# Patient Record
Sex: Male | Born: 1970
Health system: Southern US, Community
[De-identification: ages and names within clinical notes are randomized; demographics above are authoritative.]

## PROBLEM LIST (undated history)

## (undated) DIAGNOSIS — A630 Anogenital (venereal) warts: Secondary | ICD-10-CM

## (undated) DIAGNOSIS — K2100 Gastro-esophageal reflux disease with esophagitis, without bleeding: Secondary | ICD-10-CM

## (undated) DIAGNOSIS — T7840XA Allergy, unspecified, initial encounter: Secondary | ICD-10-CM

## (undated) DIAGNOSIS — K219 Gastro-esophageal reflux disease without esophagitis: Secondary | ICD-10-CM

## (undated) HISTORY — DX: Gastro-esophageal reflux disease with esophagitis, without bleeding: K21.00

## (undated) HISTORY — PX: FRACTURE SURGERY: SHX138

## (undated) HISTORY — DX: Anogenital (venereal) warts: A63.0

## (undated) HISTORY — PX: KNEE ARTHROSCOPY: SUR90

## (undated) HISTORY — DX: Gastro-esophageal reflux disease with esophagitis: K21.0

## (undated) HISTORY — DX: Allergy, unspecified, initial encounter: T78.40XA

## (undated) HISTORY — DX: Gastro-esophageal reflux disease without esophagitis: K21.9

---

## 1999-08-07 ENCOUNTER — Emergency Department (HOSPITAL_COMMUNITY): Admission: EM | Admit: 1999-08-07 | Discharge: 1999-08-07 | Payer: Self-pay | Admitting: Emergency Medicine

## 1999-10-16 ENCOUNTER — Encounter: Admission: RE | Admit: 1999-10-16 | Discharge: 1999-10-16 | Payer: Self-pay | Admitting: Family Medicine

## 1999-10-16 ENCOUNTER — Encounter: Payer: Self-pay | Admitting: Family Medicine

## 2005-04-07 ENCOUNTER — Encounter: Admission: RE | Admit: 2005-04-07 | Discharge: 2005-04-07 | Payer: Self-pay | Admitting: Sports Medicine

## 2005-04-15 ENCOUNTER — Ambulatory Visit (HOSPITAL_COMMUNITY): Admission: RE | Admit: 2005-04-15 | Discharge: 2005-04-15 | Payer: Self-pay | Admitting: Orthopedic Surgery

## 2005-12-27 ENCOUNTER — Encounter: Admission: RE | Admit: 2005-12-27 | Discharge: 2005-12-27 | Payer: Self-pay | Admitting: Family Medicine

## 2006-11-19 DIAGNOSIS — A630 Anogenital (venereal) warts: Secondary | ICD-10-CM

## 2006-11-19 HISTORY — DX: Anogenital (venereal) warts: A63.0

## 2006-11-30 ENCOUNTER — Ambulatory Visit: Payer: Self-pay | Admitting: Family Medicine

## 2007-01-31 ENCOUNTER — Encounter: Admission: RE | Admit: 2007-01-31 | Discharge: 2007-01-31 | Payer: Self-pay | Admitting: Family Medicine

## 2007-01-31 ENCOUNTER — Ambulatory Visit: Payer: Self-pay | Admitting: Family Medicine

## 2007-05-08 ENCOUNTER — Ambulatory Visit: Payer: Self-pay | Admitting: Family Medicine

## 2008-05-09 ENCOUNTER — Ambulatory Visit: Payer: Self-pay | Admitting: Family Medicine

## 2008-06-18 ENCOUNTER — Ambulatory Visit: Payer: Self-pay | Admitting: Family Medicine

## 2008-07-16 ENCOUNTER — Ambulatory Visit: Payer: Self-pay | Admitting: Family Medicine

## 2009-03-06 ENCOUNTER — Ambulatory Visit: Payer: Self-pay | Admitting: Family Medicine

## 2010-05-12 ENCOUNTER — Ambulatory Visit: Payer: Self-pay | Admitting: Family Medicine

## 2010-05-14 ENCOUNTER — Encounter: Admission: RE | Admit: 2010-05-14 | Discharge: 2010-05-14 | Payer: Self-pay | Admitting: Family Medicine

## 2010-05-26 ENCOUNTER — Ambulatory Visit: Payer: Self-pay | Admitting: Physician Assistant

## 2010-10-14 ENCOUNTER — Ambulatory Visit
Admission: RE | Admit: 2010-10-14 | Discharge: 2010-10-14 | Payer: Self-pay | Source: Home / Self Care | Attending: Family Medicine | Admitting: Family Medicine

## 2011-02-05 NOTE — Op Note (Signed)
[name], VACA NO.:  [id number]   MEDICAL RECORD NO.:  [id number]          PATIENT TYPE:  OIB   LOCATION:  2899                         FACILITY:  [facility]   PHYSICIAN:  Doralee Albino. Carola Frost, M.D. DATE OF BIRTH:  Sep 01, 1971   DATE OF PROCEDURE:  04/15/2005  DATE OF DISCHARGE:                                 OPERATIVE REPORT   PREOPERATIVE DIAGNOSES:  Right distal radius fracture.   POSTOP DIAGNOSIS:  Right distal radius fracture.   PROCEDURE:  Open reduction internal fixation, right distal radius, using the  DVR plate.   SURGEON:  Doralee Albino. Carola Frost, M.D.   ASSISTANT:  Cecil Cranker, PA   ANESTHESIA:  General.   COMPLICATIONS:  None.   TOTAL TOURNIQUET TIME:  An hour and 10 minutes.   BLOOD LOSS:  Less than 10 cc.   SPECIMENS:  None.   DRAINS:  None.   DISPOSITION:  PACU. Condition stable.   BRIEF SUMMARY OF INDICATIONS FOR PROCEDURE:  Bryan Khan is a 40 year old right-hand dominant white male who sustained  an intra-articular displaced dorsally comminuted distal radius fracture  which was initially evaluated with plain film and then CT scan. After a  discussion of the risk and benefits of surgery including possible infection,  nerve injury, vessel injury, loss of motion, arthritis, malunion and  nonunion, the patient wished to proceed.   DESCRIPTION OF PROCEDURE:  Bryan Khan is administered preoperative  antibiotics, taken to operating room where general anesthesia was induced  and his right upper extremity prepped and draped in usual sterile fashion  with tourniquet about the upper arm. After a standard volar Henry approach  with a 4.5 centimeter incision over the FCR tendon and incision of the deep  aspect of the tendon sheath, the pronator was revealed. The Esmarch bandage  was then used to exsanguinate the extremity prior to incising the fascia of  the pronator. Tourniquet was inflated to 250 mmHg.  The pronator fascia was  incised  along the radial edge leaving a cuff of tissue for repair.  It was  elevated with the [facility] and then Hohmann's placed along the radial styloid  and the ulnar corner of the distal radius. The fracture site was mobilized  with manual manipulation and then the Hohmann's used to apply distal and  compressive forces across the articular surface. The plate was then fixed to  the volar aspect of the radius using the sliding hole with the 35 screw.  Appropriate position proximally and distally was then held with an  additional 35 screws. These were not tightened all the way into the surface  of the plate so that upon final tightening we could achieve a few more  degrees of volar angulation. Multiple towels were stacked underneath the  carpus and reduction maneuver performed with direct dorsally directed  pressure as well as the Hohmann's as described above.  Screws were placed  into the ulnar corner fragment as well as the radial styloid fragment and  then reduction checked under fluoro with two views demonstrating restoration  of radial height and the articular congruity of  the hardware placement was  appropriate as well. The wound was irrigated and then closed in standard  layered fashion with 2-0 Vicryl for the pronator and subcu and nylon for the  skin. Sterile gently compressive dressing and volar splint with the wrist in  supination was then applied. The patient was awakened from his anesthesia  and taken to PACU in stable condition.   PROGNOSIS:  Mr. Clos prognosis with this injury is favorable given the  restoration of his height inclination and congruity.  We will see him back  in clinic in 10 days for suture removal amd at that time anticipate  transition into a removable brace and initiation of supervised early range  of motion.       [facility]/MEDQ  D:  04/15/2005  T:  04/15/2005  Job:  [id number]

## 2011-03-12 ENCOUNTER — Encounter: Payer: Self-pay | Admitting: Medical

## 2011-03-12 ENCOUNTER — Ambulatory Visit (INDEPENDENT_AMBULATORY_CARE_PROVIDER_SITE_OTHER): Admitting: Medical

## 2011-03-12 VITALS — BP 120/78 | HR 58 | Temp 98.2°F | Ht 69.0 in | Wt 178.0 lb

## 2011-03-12 DIAGNOSIS — R209 Unspecified disturbances of skin sensation: Secondary | ICD-10-CM

## 2011-03-12 DIAGNOSIS — R202 Paresthesia of skin: Secondary | ICD-10-CM

## 2011-03-12 DIAGNOSIS — G43919 Migraine, unspecified, intractable, without status migrainosus: Secondary | ICD-10-CM

## 2011-03-12 MED ORDER — SUMATRIPTAN-NAPROXEN SODIUM 85-500 MG PO TABS: 1.0000 | ORAL_TABLET | ORAL | Status: DC | PRN

## 2011-03-12 NOTE — Progress Notes (Signed)
Subjective:   HPI  Bryan Khan is a 40 y.o. male who presents for bad headache x 4 days.  He awoke 4 days ago with a headache, and his left arm has felt heavy and sore, but he has been working out.  He describes the headache as dull and throbbing, some sensitivity to sound, tearful at times.  The headache has been constant, but has been varying in intensity.  Using 5 Advil at a time, no relief.  Trigger unknown.  Exercising more than usual since 1.5 mo ago.  He notes that he has been sore in his shoulders from recent increased weight training.   He denies prior diagnosis of headache problems. He has not recently changed any dietary or caffeine consumption. He drinks coffee a few times a week. Drinks 1-2 alcoholic beverages a week. He does not skip meals and his meal consent has been consistent.  He denies allergy problems. Work has been more stressful in general. He is a Landscape architect. He did get married 2 weeks ago  No other aggravating or relieving factors.  No other c/o.  The following portions of the patient's history were reviewed and updated as appropriate: allergies, current medications, past family history, past medical history, past social history, past surgical history and problem list.   Review of Systems Constitutional: denies fever, chills, sweats, unexpected weight change, anorexia, fatigue Allergy: no congestion, sneezing Dermatology: denies rash ENT: no runny nose, ear pain, sore throat, hoarseness, sinus pain Cardiology: +intermittent CP 15-20 minutes, has seen Dr. Susann Givens for this prior - stress related; denies palpitations, edema Respiratory: denies cough, shortness of breath, wheezing Gastroenterology: +nausea; denies abdominal pain, vomiting, diarrhea, constipation, Hematology: denies bleeding or bruising problems Musculoskeletal: +left shoulder and neck stiff, but been lifting weights;  denies arthralgias, myalgias, joint swelling, back pain Ophthalmology: denies  vision changes Urology: denies dysuria, difficulty urinating, hematuria, urinary frequency, urgency Neurology: +left hand tingling x 3 days;  no weakness, tingling, numbness      Objective:   Physical Exam  General appearance: alert, no distress, WD/WN, white male HEENT: normocephalic, sclerae anicteric, PERRLA, EOMi, nares patent, no discharge or erythema, pharynx normal Oral cavity: MMM, no lesions Neck: supple, no lymphadenopathy, no thyromegaly, no masses, no bruits Heart: RRR, normal S1, S2, no murmurs Lungs: CTA bilaterally, no wheezes, rhonchi, or rales Abdomen: +bs, soft, non tender, non distended, no masses, no hepatomegaly, no splenomegaly Musculoskeletal: mild left neck tenderness and upper back tenderness on the left, otherwise non tender, no swelling, no obvious deformity Extremities: no edema, no cyanosis, no clubbing Pulses: 2+ symmetric, upper and lower extremities, normal cap refill Neurological: alert, oriented x 3, CN2-12 intact, strength normal upper extremities and lower extremities, sensation normal throughout, DTRs 2+ throughout, no cerebellar signs, gait normal Psychiatric: normal affect, behavior normal, pleasant    Assessment :    Encounter Diagnoses  Name Primary?  . Migraine with intractable migraine Yes  . Paresthesia      Plan:  Etiology most likely atypical migraine.  I advised that I would like him to try Teximet and remain here for the next hour for monitoring. However he declines.   Thus, I sent him home with 2 samples of Treximet.  I advised he take one tablet now, go rest in a quiet dark place, and if he has not improved in 2 hours can take the second tablet. Last him to call back this afternoon to let us known how he is feeling.  He ended up  calling back and said the headache was much improved.  I advised to recheck in 1 week.

## 2011-03-15 ENCOUNTER — Encounter: Payer: Self-pay | Admitting: Family Medicine

## 2011-03-15 DIAGNOSIS — A630 Anogenital (venereal) warts: Secondary | ICD-10-CM

## 2011-03-15 DIAGNOSIS — J309 Allergic rhinitis, unspecified: Secondary | ICD-10-CM | POA: Insufficient documentation

## 2011-03-15 DIAGNOSIS — M549 Dorsalgia, unspecified: Secondary | ICD-10-CM

## 2011-03-16 ENCOUNTER — Ambulatory Visit: Payer: Self-pay

## 2011-04-15 ENCOUNTER — Other Ambulatory Visit (INDEPENDENT_AMBULATORY_CARE_PROVIDER_SITE_OTHER)

## 2011-04-15 DIAGNOSIS — Z0000 Encounter for general adult medical examination without abnormal findings: Secondary | ICD-10-CM

## 2011-04-15 DIAGNOSIS — Z23 Encounter for immunization: Secondary | ICD-10-CM

## 2011-08-17 ENCOUNTER — Encounter: Payer: Self-pay | Admitting: Family Medicine

## 2011-08-18 ENCOUNTER — Ambulatory Visit (INDEPENDENT_AMBULATORY_CARE_PROVIDER_SITE_OTHER): Admitting: Family Medicine

## 2011-08-18 ENCOUNTER — Encounter: Payer: Self-pay | Admitting: Family Medicine

## 2011-08-18 VITALS — BP 118/80 | HR 60 | Wt 182.0 lb

## 2011-08-18 DIAGNOSIS — K219 Gastro-esophageal reflux disease without esophagitis: Secondary | ICD-10-CM

## 2011-08-18 DIAGNOSIS — F329 Major depressive disorder, single episode, unspecified: Secondary | ICD-10-CM

## 2011-08-18 MED ORDER — OMEPRAZOLE 20 MG PO CPDR: 20.0000 mg | DELAYED_RELEASE_CAPSULE | Freq: Every day | ORAL | Status: DC

## 2011-08-18 MED ORDER — CITALOPRAM HYDROBROMIDE 20 MG PO TABS: 20.0000 mg | ORAL_TABLET | Freq: Every day | ORAL | Status: DC

## 2011-08-18 NOTE — Patient Instructions (Signed)
Call the  numbers that I gave you to see which one takes your insurance and let me know

## 2011-08-18 NOTE — Progress Notes (Signed)
  Subjective:    Patient ID: Bryan Khan, male    DOB: 20-Oct-1970, 40 y.o.   MRN: [id number]  HPI He is here for consult concerning continued difficulty with reflux symptoms. He also complains of chest pain and headache but states the chest pain and headaches both occur at times of stress which is usually at the end of the month when his work related issues become more of an issue. He then went on to explain that he had been under a lot of stress dealing with family related issues and is concerned about possibly losing his job. He complains of loss of interest in daily activities. In the past he had been seen at the [facility].  Review of Systems     Objective:   Physical Exam  alert and tearful. His affect is appropriate.        Assessment & Plan:   1. Depression   2. GERD (gastroesophageal reflux disease)    I will renew his Prilosec. Gave him the phone numbers of several potential counseling centers that might take his insurance. Recheck here in one month. He was also placed on Celexa. Discussed possible side effects of this medicine with him.

## 2011-09-16 ENCOUNTER — Encounter: Payer: Self-pay | Admitting: Family Medicine

## 2011-09-16 ENCOUNTER — Ambulatory Visit (INDEPENDENT_AMBULATORY_CARE_PROVIDER_SITE_OTHER): Admitting: Family Medicine

## 2011-09-16 VITALS — BP 110/70 | HR 60 | Wt 181.0 lb

## 2011-09-16 DIAGNOSIS — F329 Major depressive disorder, single episode, unspecified: Secondary | ICD-10-CM

## 2011-09-16 NOTE — Progress Notes (Signed)
  Subjective:    Patient ID: Bryan Khan, male    DOB: 02/02/1971, 40 y.o.   MRN: [id number]  HPI He is here for recheck. He does state he is doing much better. He has switched counselors and is now going to [facility] psychological. He states that he is roughly 80% better feeling much more positive and happy about all aspects of his life. He also states he is having no reflux symptoms at the present time and is using Prilosec twice per week.  Review of Systems     Objective:   Physical Exam Alert and in no distress otherwise not examined      Assessment & Plan:   1. Depression   2  GERD Continue present medication regimen and in counseling. Recommend he reduce Prilosec to as needed. Recheck here in one month

## 2011-09-16 NOTE — Patient Instructions (Signed)
Continue with counseling. I will see you in one month.

## 2011-10-18 ENCOUNTER — Ambulatory Visit (INDEPENDENT_AMBULATORY_CARE_PROVIDER_SITE_OTHER): Admitting: Family Medicine

## 2011-10-18 ENCOUNTER — Encounter: Payer: Self-pay | Admitting: Family Medicine

## 2011-10-18 VITALS — BP 130/80 | HR 60 | Ht 70.0 in | Wt 184.0 lb

## 2011-10-18 DIAGNOSIS — F329 Major depressive disorder, single episode, unspecified: Secondary | ICD-10-CM

## 2011-10-18 DIAGNOSIS — F3289 Other specified depressive episodes: Secondary | ICD-10-CM

## 2011-10-18 MED ORDER — CITALOPRAM HYDROBROMIDE 40 MG PO TABS: 40.0000 mg | ORAL_TABLET | Freq: Every day | ORAL | Status: DC

## 2011-10-18 NOTE — Progress Notes (Signed)
  Subjective:    Patient ID: Bryan Khan, male    DOB: 18-Jul-1971, 41 y.o.   MRN: [id number]  HPI He is here for recheck. He states he is somewhat better from his last visit. He continues in counseling. His work is going quite well and he has a good attitude towards this.   Review of Systems     Objective:   Physical Exam Alert and in no distress with appropriate affect       Assessment & Plan:   1. Depression    after a discussion with him, have decided to increase his Celexa. He seems to be doing fairly well although I think we should try to help him do better. We'll continue in counseling. He is to call me in one month and if this new dosing works, I will continue and recheck in 6 months

## 2011-10-18 NOTE — Patient Instructions (Addendum)
Call me in a month to let me know how the new dosing is working Take your present medicines at 2 pills per day. Take them at one-time

## 2011-11-05 ENCOUNTER — Other Ambulatory Visit

## 2011-11-05 DIAGNOSIS — Z23 Encounter for immunization: Secondary | ICD-10-CM

## 2011-11-09 ENCOUNTER — Other Ambulatory Visit: Payer: Self-pay | Admitting: Family Medicine

## 2011-11-09 NOTE — Telephone Encounter (Signed)
Is this ok?

## 2012-01-25 ENCOUNTER — Ambulatory Visit (INDEPENDENT_AMBULATORY_CARE_PROVIDER_SITE_OTHER): Admitting: Family Medicine

## 2012-01-25 ENCOUNTER — Encounter: Payer: Self-pay | Admitting: Family Medicine

## 2012-01-25 VITALS — BP 110/70 | HR 81 | Wt 193.0 lb

## 2012-01-25 DIAGNOSIS — Z13220 Encounter for screening for lipoid disorders: Secondary | ICD-10-CM

## 2012-01-25 DIAGNOSIS — M25569 Pain in unspecified knee: Secondary | ICD-10-CM

## 2012-01-25 DIAGNOSIS — M25561 Pain in right knee: Secondary | ICD-10-CM

## 2012-01-25 LAB — LIPID PANEL
LDL Cholesterol: 88 mg/dL (ref 0–99)
Total CHOL/HDL Ratio: 2.7 Ratio
VLDL: 9 mg/dL (ref 0–40)

## 2012-01-25 NOTE — Progress Notes (Signed)
Addended by: Lavell Islam on: 01/25/2012 04:03 PM   Modules accepted: Orders

## 2012-01-25 NOTE — Progress Notes (Signed)
  Subjective:    Patient ID: Bryan Khan, male    DOB: 10/28/1970, 41 y.o.   MRN: [id number]  HPI He is here for evaluation of right knee pain. He relates this to starting about a year ago while running. He noted soreness in his knee. No popping, locking or grinding. He did stop running for several months. He started up again and immediately had difficulty with pain. He describes an incident of running in stating that the leg locked causing him to fall and this was in January. Although he describes this as a locking sensation he was able to extend his knee after the fall He stopped exercising but continues to have pain. .Review of Systems     Objective:   Physical Exam Small effusion is noted. Tenderness palpation to the posterior medial aspect of his knee joint. McMurray's testing causes pain. Anterior drawer is negative. Other ligaments intact.      Assessment & Plan:  Right knee possible meniscal damage. An attempt was made to order an MRI since he has had difficulty with this for a long time however his insurance would not allow this. He will followup with his regular primary care physician.

## 2012-01-26 NOTE — Progress Notes (Signed)
Quick Note:  The blood work is normal ______ 

## 2012-01-31 ENCOUNTER — Emergency Department (HOSPITAL_COMMUNITY)

## 2012-01-31 ENCOUNTER — Encounter (HOSPITAL_COMMUNITY): Payer: Self-pay | Admitting: *Deleted

## 2012-01-31 DIAGNOSIS — Y92009 Unspecified place in unspecified non-institutional (private) residence as the place of occurrence of the external cause: Secondary | ICD-10-CM | POA: Insufficient documentation

## 2012-01-31 DIAGNOSIS — X500XXA Overexertion from strenuous movement or load, initial encounter: Secondary | ICD-10-CM | POA: Insufficient documentation

## 2012-01-31 DIAGNOSIS — S93409A Sprain of unspecified ligament of unspecified ankle, initial encounter: Secondary | ICD-10-CM | POA: Insufficient documentation

## 2012-01-31 NOTE — ED Notes (Signed)
Twisted lt ankle 3 pm, swelling present.

## 2012-02-01 ENCOUNTER — Emergency Department (HOSPITAL_COMMUNITY)
Admission: EM | Admit: 2012-02-01 | Discharge: 2012-02-01 | Disposition: A | Discharge status: Home / Self Care | Attending: Emergency Medicine | Admitting: Emergency Medicine

## 2012-02-01 DIAGNOSIS — S93402A Sprain of unspecified ligament of left ankle, initial encounter: Secondary | ICD-10-CM

## 2012-02-01 MED ORDER — HYDROCODONE-ACETAMINOPHEN 5-325 MG PO TABS: ORAL_TABLET | ORAL | Status: DC

## 2012-02-01 MED ORDER — HYDROCODONE-ACETAMINOPHEN 5-325 MG PO TABS
1.0000 | ORAL_TABLET | Freq: Once | ORAL | Status: AC
  Administered 2012-02-01: 1 via ORAL
  Filled 2012-02-01: qty 1

## 2012-02-01 NOTE — ED Notes (Signed)
Pt. Alert and oriented, discharged to home, NAD noted

## 2012-02-01 NOTE — ED Notes (Signed)
Received pt. From triage via w/c, pt. Alert and oriented, c/o left ankle pain pt. Larey Seat  todasy

## 2012-02-01 NOTE — Discharge Instructions (Signed)
Ankle Sprain You have a sprained ankle. When you twist or sprain your ankle, the ligaments that hold the joint together are injured. This usually causes a lot of swelling and pain. Although these injuries can be quite severe, proper treatment will reduce your pain, shorten the period of disability, and help prevent re-injury. To treat a sprained ankle you should:  Elevate your ankle for the next 2 to 4 days.   During this period apply ice packs to the injury for 20 to 30 minutes every 2 to 3 hours.   Keep the ankle wrapped in a compression bandage or splint as long as it is painful or swollen.   Do not walk on your ankle if it still hurts. This can slow the healing. Gentle range of motion exercises, however, can help decrease disability.   Use crutches if necessary until weight bearing is painless.   Prescription pain medicine may be needed to relieve discomfort.  A plaster or fiberglass splint may be applied initially. As your sprain improves, air, foam or gel-lined braces can be used to protect the ankle from further injury until the joint is completely healed. Ankle rehabilitation exercises may also be used to speed your recovery and make the joint more stable. Most moderate ankle sprains will heal completely in 6 weeks. However, if the sprain is severe, a cast or even surgery may be needed. Restrict your activities and see your doctor for follow-up as advised. If you have persistent pain, further evaluation and x-rays may be needed. Document Released: 10/14/2004 Document Revised: 08/26/2011 Document Reviewed: 09/07/2008 [facility] Patient Information 2012 [place], [place].  Follow up with your MD as planned.

## 2012-02-01 NOTE — ED Provider Notes (Signed)
History     CSN: [id number]  Arrival date & time 01/31/12  2209   First MD Initiated Contact with Patient 02/01/12 0051      Chief Complaint  Patient presents with  . Ankle Pain    (Consider location/radiation/quality/duration/timing/severity/associated sxs/prior treatment) HPI Comments: Stepping out of his shed and inverted L ankle.  No other injuries.  Has appt with PCP in a few days for knee related injury.    Patient is a 41 y.o. male presenting with ankle pain. The history is provided by the patient. No language interpreter was used.  Ankle Pain  The incident occurred less than 1 hour ago. The incident occurred at home. The pain is present in the left ankle. The quality of the pain is described as throbbing. The pain is at a severity of 7/10. The pain has been constant since onset. Associated symptoms include inability to bear weight and tingling. Pertinent negatives include no numbness, no loss of motion, no muscle weakness and no loss of sensation. He reports no foreign bodies present. The symptoms are aggravated by bearing weight. He has tried nothing for the symptoms.    Past Medical History  Diagnosis Date  . Allergy   . 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 Reflux     Past Surgical History  Procedure Date  . Fracture surgery     Family History  Problem Relation Age of Onset  . Hyperlipidemia Father   . Anxiety disorder Father   . Aneurysm Cousin   . Hypertension Paternal Uncle   . Diabetes Maternal Grandfather   . Heart disease Maternal Grandfather   . Hypertension Paternal Grandmother   . Cancer Paternal Grandfather     History  Substance Use Topics  . Smoking status: Former Smoker    Quit date: 02/01/1994  . Smokeless tobacco: Never Used  . Alcohol Use: 1.0 oz/week    2 drink(s) per week      Review of Systems  Musculoskeletal:       Ankle injury  Neurological: Positive for tingling. Negative for weakness and numbness.       Tingling   All other systems  reviewed and are negative.    Allergies  Codeine  Home Medications   Current Outpatient Rx  Name Route Sig Dispense Refill  . CITALOPRAM HYDROBROMIDE 40 MG PO TABS Oral Take 1 tablet (40 mg total) by mouth daily. 30 tablet 5  . HYDROCODONE-ACETAMINOPHEN 5-325 MG PO TABS  One tab po q 4-6 hrs prn pain 20 tablet 0  . OMEPRAZOLE 20 MG PO CPDR Oral Take 1 capsule (20 mg total) by mouth daily. 30 capsule 11    BP 124/75  Pulse 76  Temp(Src) 98.2 F (36.8 C) (Oral)  Resp 20  Ht 5\' 9"  (1.753 m)  Wt 184 lb (83.462 kg)  BMI 27.17 kg/m2  SpO2 98%  Physical Exam  Nursing note and vitals reviewed. Constitutional: He is oriented to person, place, and time. He appears well-developed and well-nourished.  HENT:  Head: Normocephalic and atraumatic.  Eyes: EOM are normal.  Neck: Normal range of motion.  Cardiovascular: Normal rate, regular rhythm, normal heart sounds and intact distal pulses.   Pulmonary/Chest: Effort normal and breath sounds normal. No respiratory distress.  Abdominal: Soft. He exhibits no distension. There is no tenderness.  Musculoskeletal: He exhibits tenderness.       Swelling over L lateral malleolus.  No ecchymosis.  Skin intact.   Worse with palpation and weight bearing.  Skin intact.  Neurological: He is alert and oriented to person, place, and time.  Skin: Skin is warm and dry.  Psychiatric: He has a normal mood and affect. Judgment normal.    ED Course  Procedures (including critical care time)  Labs Reviewed - No data to display Dg Ankle Complete Left  01/31/2012  *RADIOLOGY REPORT*  Clinical Data: Twist twisting left ankle injury with lateral pain and swelling.  LEFT ANKLE COMPLETE - 3+ VIEW  Comparison: None.  Findings: Soft tissue swelling over the lateral malleolus noted without underlying fracture observed.  Plafond and talar dome appear intact.  Faint calcification anteriorly along the tibiotalar articulation shown on the lateral projection is of  uncertain significance, but could be due to spurring or conceivably a small anterior tibiotalar avulsion.  IMPRESSION:  1.  No abnormal anterolateral soft tissue swelling.  Small calcific density projecting anterior to the tibiotalar articulation is not correlated on other projections but could conceivably represent a small avulsion injury of the anterior tibia or talus.  MRI would be the preferred modality for further investigation, if clinically warranted.  Original Report Authenticated By: Dellia Cloud, M.D.     1. Left ankle sprain       MDM  [place], ice, elevation, declined crutches. rx-hydrocodone, 20 otc ibuprofen 800 mg TID        Worthy Rancher, PA 02/01/12 0106  Worthy Rancher, PA 02/01/12 [phone]

## 2012-02-02 NOTE — ED Provider Notes (Signed)
Medical screening examination/treatment/procedure(s) were performed by non-physician practitioner and as supervising physician I was immediately available for consultation/collaboration.  Nicoletta Dress. Colon Branch, MD 02/02/12 [phone]

## 2012-04-24 ENCOUNTER — Other Ambulatory Visit: Payer: Self-pay | Admitting: Family Medicine

## 2012-04-24 NOTE — Telephone Encounter (Signed)
He needs a follow-up appointment

## 2012-04-24 NOTE — Telephone Encounter (Signed)
IS THIS OK 

## 2012-04-24 NOTE — Telephone Encounter (Signed)
I called the medicine in but have him schedule a followup appointment

## 2012-04-24 NOTE — Telephone Encounter (Signed)
Pt informed of follow up appt he said he would call back when he gets his calender in front of him

## 2012-09-12 ENCOUNTER — Other Ambulatory Visit: Payer: Self-pay | Admitting: Family Medicine

## 2013-02-09 ENCOUNTER — Telehealth: Payer: Self-pay | Admitting: Family Medicine

## 2013-02-09 MED ORDER — PANTOPRAZOLE SODIUM 40 MG PO TBEC: 40.0000 mg | DELAYED_RELEASE_TABLET | Freq: Every day | ORAL | Status: DC

## 2013-02-09 NOTE — Telephone Encounter (Signed)
Try switching to protonix 40 mg poqday.

## 2013-02-09 NOTE — Telephone Encounter (Signed)
..  Patient aware and rx e-scribed

## 2013-03-14 ENCOUNTER — Ambulatory Visit (INDEPENDENT_AMBULATORY_CARE_PROVIDER_SITE_OTHER): Admitting: Physician Assistant

## 2013-03-14 ENCOUNTER — Encounter: Payer: Self-pay | Admitting: Physician Assistant

## 2013-03-14 DIAGNOSIS — M5430 Sciatica, unspecified side: Secondary | ICD-10-CM

## 2013-03-14 MED ORDER — PREDNISONE 20 MG PO TABS: ORAL_TABLET | ORAL | Status: DC

## 2013-03-14 MED ORDER — CYCLOBENZAPRINE HCL 10 MG PO TABS: 10.0000 mg | ORAL_TABLET | Freq: Three times a day (TID) | ORAL | Status: DC | PRN

## 2013-03-14 MED ORDER — MELOXICAM 7.5 MG PO TABS: 7.5000 mg | ORAL_TABLET | Freq: Every day | ORAL | Status: DC

## 2013-03-14 NOTE — Progress Notes (Signed)
Patient ID: Bryan Khan MRN: [id number], DOB: 06/15/1971, 42 y.o. Date of Encounter: 03/14/2013, 1:44 PM    Chief Complaint:  Chief Complaint  Patient presents with  . c/o rt hip pain x 2 mths     HPI: 42 y.o. year old white male is here for eval of pain in right lateral buttock area. Says that he has always been extremely active. In Aug 2013 he had right meniscus surgery . Surgeon told him not to run any more. However, he needed to run b/c of military-he rediscussed it with his surgeon etc. Nonetheless, has started back to running. Every time he runs, sometime b/t running 1/2 to 1 mile, he develops pain in right lateral buttock. 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  Reports that he has never had any real problems with his low back.About 3 years ago prior PCP did XRay (normal per pt) and treated with muscle relaxer.   He has no pain, numbness, tingling down leg beyond upper thigh level. No weakness in leg or foot.     Home Meds: See attached medication section for any medications that were entered at today's visit. The computer does not put those onto this list.The following list is a list of meds entered prior to today's visit.   Current Outpatient Prescriptions on File Prior to Visit  Medication Sig Dispense Refill  . pantoprazole (PROTONIX) 40 MG tablet Take 1 tablet (40 mg total) by mouth daily.  30 tablet  11  . citalopram (CELEXA) 40 MG tablet TAKE 1 TABLET EVERY DAY  30 tablet  0  . HYDROcodone-acetaminophen (NORCO) 5-325 MG per tablet One tab po q 4-6 hrs prn pain  20 tablet  0   No current facility-administered medications on file prior to visit.    Allergies:  Allergies  Allergen Reactions  . Codeine Nausea Only      Review of Systems: See HPI for pertinent ROS. All other ROS negative.    Physical Exam: Blood pressure 118/78, pulse 52, temperature 98.1 F (36.7 C), temperature source Oral, resp.  rate 16, height 5\' 10"  (1.778 m), weight 190 lb (86.183 kg)., Body mass index is 27.26 kg/(m^2). General: WNWD WM. Appears in no acute distress. Lungs: Clear bilaterally to auscultation without wheezes, rales, or rhonchi. Breathing is unlabored. Heart: Regular rhythm. No murmurs, rubs, or gallops. Msk:  Strength and tone normal for age. Low Back: No pain reproduced with palpation of low back. There is mild pain with palpation of right sciatic notch. Bilateral SLR and Hip Abduction normal. Right hip abd causes some pain in right buttock.  Bilateral leg strenght 5/5. Neuro: Alert and oriented X 3. Moves all extremities spontaneously. Gait is normal. CNII-XII grossly in tact. Psych:  Responds to questions appropriately with a normal affect.     ASSESSMENT AND PLAN:  42 y.o. year old male with  1. Low back pain with sciatica, right He is to rest the back. No running, lifting, bending etc. If pain does not resolve with prednisone and muscle relaxers in 1-2 weeks, then he is to f/u here.  He has been given note to turn into [employer] with no bending lifting running through 04/13/13. He says that as soon as that time is up, they will expect him to do PT test.  This will give him time to rest back for 1-2 weeks then start back his training for PT.  Cautioned  Flexeril may cause drowiness. Do not take Mobic until after prednisone completed.   - predniSONE (DELTASONE) 20 MG tablet; Take 3 daily for 2 days, then 2 daily for 2 days, then 1 daily for 2 days.  Dispense: 12 tablet; Refill: 0 - cyclobenzaprine (FLEXERIL) 10 MG tablet; Take 1 tablet (10 mg total) by mouth 3 (three) times daily as needed for muscle spasms.  Dispense: 30 tablet; Refill: 0 - meloxicam (MOBIC) 7.5 MG tablet; Take 1 tablet (7.5 mg total) by mouth daily.  Dispense: 30 tablet; Refill: 0    Signed, [address] [place], [place], [facility] 03/14/2013 1:44 PM

## 2013-05-27 ENCOUNTER — Emergency Department (HOSPITAL_COMMUNITY)
Admission: EM | Admit: 2013-05-27 | Discharge: 2013-05-27 | Disposition: A | Discharge status: Home / Self Care | Attending: Emergency Medicine | Admitting: Emergency Medicine

## 2013-05-27 ENCOUNTER — Encounter (HOSPITAL_COMMUNITY): Payer: Self-pay

## 2013-05-27 DIAGNOSIS — Z87891 Personal history of nicotine dependence: Secondary | ICD-10-CM | POA: Insufficient documentation

## 2013-05-27 DIAGNOSIS — S058X9A Other injuries of unspecified eye and orbit, initial encounter: Secondary | ICD-10-CM | POA: Insufficient documentation

## 2013-05-27 DIAGNOSIS — S0502XA Injury of conjunctiva and corneal abrasion without foreign body, left eye, initial encounter: Secondary | ICD-10-CM

## 2013-05-27 DIAGNOSIS — Z79899 Other long term (current) drug therapy: Secondary | ICD-10-CM | POA: Insufficient documentation

## 2013-05-27 DIAGNOSIS — K219 Gastro-esophageal reflux disease without esophagitis: Secondary | ICD-10-CM | POA: Insufficient documentation

## 2013-05-27 DIAGNOSIS — Y9389 Activity, other specified: Secondary | ICD-10-CM | POA: Insufficient documentation

## 2013-05-27 DIAGNOSIS — Y92410 Unspecified street and highway as the place of occurrence of the external cause: Secondary | ICD-10-CM | POA: Insufficient documentation

## 2013-05-27 DIAGNOSIS — IMO0002 Reserved for concepts with insufficient information to code with codable children: Secondary | ICD-10-CM | POA: Insufficient documentation

## 2013-05-27 DIAGNOSIS — Z8619 Personal history of other infectious and parasitic diseases: Secondary | ICD-10-CM | POA: Insufficient documentation

## 2013-05-27 MED ORDER — TETRACAINE HCL 0.5 % OP SOLN
1.0000 [drp] | Freq: Once | OPHTHALMIC | Status: AC
  Administered 2013-05-27: 1 [drp] via OPHTHALMIC
  Filled 2013-05-27: qty 2

## 2013-05-27 MED ORDER — FLUORESCEIN SODIUM 1 MG OP STRP
1.0000 | ORAL_STRIP | Freq: Once | OPHTHALMIC | Status: AC
  Administered 2013-05-27: 1 via OPHTHALMIC
  Filled 2013-05-27: qty 1

## 2013-05-27 MED ORDER — ERYTHROMYCIN 5 MG/GM OP OINT
TOPICAL_OINTMENT | Freq: Once | OPHTHALMIC | Status: AC
  Administered 2013-05-27: 1 via OPHTHALMIC
  Filled 2013-05-27: qty 3.5

## 2013-05-27 MED ORDER — HYDROCODONE-ACETAMINOPHEN 5-325 MG PO TABS: 1.0000 | ORAL_TABLET | ORAL | Status: DC | PRN
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
## 2013-05-29 NOTE — ED Provider Notes (Signed)
Medical screening examination/treatment/procedure(s) were performed by non-physician practitioner and as supervising physician I was immediately available for consultation/collaboration.  Donnetta Hutching, MD 05/29/13 [phone]

## 2013-07-26 ENCOUNTER — Encounter: Payer: Self-pay | Admitting: Physician Assistant

## 2013-07-26 ENCOUNTER — Ambulatory Visit (INDEPENDENT_AMBULATORY_CARE_PROVIDER_SITE_OTHER): Admitting: Physician Assistant

## 2013-07-26 VITALS — BP 134/78 | HR 64 | Temp 98.4°F | Resp 18 | Ht 69.5 in | Wt 190.0 lb

## 2013-07-26 DIAGNOSIS — K219 Gastro-esophageal reflux disease without esophagitis: Secondary | ICD-10-CM

## 2013-07-26 DIAGNOSIS — K920 Hematemesis: Secondary | ICD-10-CM

## 2013-07-26 DIAGNOSIS — R112 Nausea with vomiting, unspecified: Secondary | ICD-10-CM

## 2013-07-26 LAB — CBC WITH DIFFERENTIAL/PLATELET
Basophils Absolute: 0.1 10*3/uL (ref 0.0–0.1)
Basophils Relative: 1 % (ref 0–1)
Eosinophils Absolute: 0.2 10*3/uL (ref 0.0–0.7)
HCT: 43.8 % (ref 39.0–52.0)
Hemoglobin: 15.5 g/dL (ref 13.0–17.0)
MCHC: 35.4 g/dL (ref 30.0–36.0)
MCV: 86.4 fL (ref 78.0–100.0)
Monocytes Absolute: 0.6 10*3/uL (ref 0.1–1.0)
Monocytes Relative: 7 % (ref 3–12)
Neutro Abs: 6.2 10*3/uL (ref 1.7–7.7)
RBC: 5.07 MIL/uL (ref 4.22–5.81)

## 2013-07-26 LAB — COMPLETE METABOLIC PANEL WITH GFR
ALT: 36 U/L (ref 0–53)
CO2: 26 mEq/L (ref 19–32)
Chloride: 106 mEq/L (ref 96–112)
Creat: 1.12 mg/dL (ref 0.50–1.35)
GFR, Est African American: >89 mL/min
GFR, Est Non African American: 81 mL/min
Glucose, Bld: 88 mg/dL (ref 70–99)
Potassium: 4.6 mEq/L (ref 3.5–5.3)
Sodium: 142 mEq/L (ref 135–145)

## 2013-07-26 NOTE — Progress Notes (Signed)
Patient ID: Bryan Khan MRN: [id number], DOB: 04-05-1971, 42 y.o. Date of Encounter: @DATE @  Chief Complaint:  Chief Complaint  Patient presents with  . nausea/vomiting all night    this AM saw blood in it  . has had diarrhea for sev weeks    HPI: 42 y.o. year old white male  presents with above complaints.  He says that over the past 2 weeks he has had some loose stools off and on. Says that was not real bad so he had not given it much thought.  Then last night during the night at about 2:30 he woke with vomiting. Says that at the end of the vomit he saw a small amount of blood. He then went and spit in the sink to make sure and says it definitely was some blood. Says it was a small amount. When woke up this morning he had one more episode of vomiting. Again at the end of the vomiting he saw a small amount of blood.  Says he does not feel nauseous. Says he does not feel sick and weak like he has a virus.  Had no fever or chills.  He says in general for a long time he has been taking medication for GERD but still has symptoms of GERD. Says that he is constantly tasting acid in his throat and can even feel it coming up into his throat.  Has stopped using NSAIDs for many months now.  Does not drink alcohol much at all. Says that last Sunday he did have some alcohol but that was the first time in months.  Has had no abdominal pain. No hematochezia or melena.   Past Medical History  Diagnosis Date  . Allergy   . 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 Reflux      Home Meds: See attached medication section for current medication list. Any medications entered into computer today will not appear on this note's list. The medications listed below were entered prior to today. Current Outpatient Prescriptions on File Prior to Visit  Medication Sig Dispense Refill  . pantoprazole (PROTONIX) 40 MG tablet Take 1 tablet (40 mg total) by mouth daily.  30 tablet  11   No current  facility-administered medications on file prior to visit.    Allergies:  Allergies  Allergen Reactions  . Codeine Nausea Only    History   Social History  . Marital Status: Single    Spouse Name: N/A    Number of Children: N/A  . Years of Education: N/A   Occupational History  . Not on file.   Social History Main Topics  . Smoking status: Former Smoker    Quit date: 02/01/1994  . Smokeless tobacco: Never Used  . Alcohol Use: 1.0 oz/week    2 drink(s) per week  . Drug Use: Yes    Special: Marijuana  . Sexual Activity: Not on file   Other Topics Concern  . Not on file   Social History Narrative  . No narrative on file    Family History  Problem Relation Age of Onset  . Hyperlipidemia Father   . Anxiety disorder Father   . Aneurysm Cousin   . Hypertension Paternal Uncle   . Diabetes Maternal Grandfather   . Heart disease Maternal Grandfather   . Hypertension Paternal Grandmother   . Cancer Paternal Grandfather      Review of Systems:  See HPI for pertinent ROS. All other ROS negative.    Physical Exam: Blood  pressure 134/78, pulse 64, temperature 98.4 F (36.9 C), temperature source Oral, resp. rate 18, height 5' 9.5" (1.765 m), weight 190 lb (86.183 kg)., Body mass index is 27.67 kg/(m^2). General:WNWD WM. Appears in no acute distress. Neck: Supple. No thyromegaly. No lymphadenopathy. Lungs: Clear bilaterally to auscultation without wheezes, rales, or rhonchi. Breathing is unlabored. Heart: RRR with S1 S2. No murmurs, rubs, or gallops. Abdomen: Soft, non-tender, non-distended with normoactive bowel sounds. No hepatomegaly. No rebound/guarding. No obvious abdominal masses. I firmly palpate entire abdomen and if there is no area of tenderness with palpation. Even the epigastric area has no tenderness. Musculoskeletal:  Strength and tone normal for age. Extremities/Skin: Warm and dry. No clubbing or cyanosis. No edema. No rashes or suspicious lesions. Neuro:  Alert and oriented X 3. Moves all extremities spontaneously. Gait is normal. CNII-XII grossly in tact. Psych:  Responds to questions appropriately with a normal affect.     ASSESSMENT AND PLAN:  42 y.o. year old male with  1. GERD (gastroesophageal reflux disease)  - CBC with Differential - COMPLETE METABOLIC PANEL WITH GFR - Helicobacter pylori abs-IgG+IgA, bld - Ambulatory referral to Gastroenterology  2. Nausea with vomiting  - CBC with Differential - COMPLETE METABOLIC PANEL WITH GFR - Helicobacter pylori abs-IgG+IgA, bld - Ambulatory referral to Gastroenterology  3. Hematemesis  - CBC with Differential - COMPLETE METABOLIC PANEL WITH GFR - Helicobacter pylori abs-IgG+IgA, bld - Ambulatory referral to Gastroenterology  Increase his Protonix to twice a day dosing. Absolutely take no  NSAID. If he has any types of aches or pains he is to use Tylenol only. Avoid alcohol and spicy foods or any other irritants to the GI system. BlAnd diet. Check labs. Refer to GI. Sounds like he needs an endoscopy. Discussed prescription for Phenergan but he defers this and says he does not feel any nausea. He will call me if he needs this. He will call if he has further episodes of vomiting and hematemesis. If this occurs we will get him in with GI urgently. Already marked by referral to them as urgent so it should be scheduled in the next couple days.    Signed, [address] [place], [place], [facility] 07/26/2013 10:49 AM

## 2013-07-27 ENCOUNTER — Other Ambulatory Visit: Payer: Self-pay | Admitting: Family Medicine

## 2013-07-27 MED ORDER — PANTOPRAZOLE SODIUM 40 MG PO TBEC: 40.0000 mg | DELAYED_RELEASE_TABLET | Freq: Two times a day (BID) | ORAL | Status: DC

## 2013-07-27 NOTE — Telephone Encounter (Signed)
Medication refilled per protocol. 

## 2013-07-30 LAB — HELICOBACTER PYLORI ABS-IGG+IGA, BLD
H Pylori IgG: >8 {ISR} — ABNORMAL HIGH
HELICOBACTER PYLORI AB, IGA: 24.5 U/mL — ABNORMAL HIGH (ref <9.0)

## 2013-08-01 ENCOUNTER — Other Ambulatory Visit: Payer: Self-pay | Admitting: Family Medicine

## 2013-08-01 MED ORDER — AMOXICILLIN 500 MG PO CAPS: 1000.0000 mg | ORAL_CAPSULE | Freq: Two times a day (BID) | ORAL | Status: DC

## 2013-08-01 MED ORDER — CLARITHROMYCIN 500 MG PO TABS: 500.0000 mg | ORAL_TABLET | Freq: Two times a day (BID) | ORAL | Status: DC

## 2013-08-01 NOTE — Telephone Encounter (Signed)
Med sent to pharm and pt will continue Protonix bid

## 2013-08-06 ENCOUNTER — Encounter: Payer: Self-pay | Admitting: Gastroenterology

## 2013-08-23 ENCOUNTER — Encounter: Payer: Self-pay | Admitting: Gastroenterology

## 2013-08-23 ENCOUNTER — Ambulatory Visit (INDEPENDENT_AMBULATORY_CARE_PROVIDER_SITE_OTHER): Admitting: Gastroenterology

## 2013-08-23 ENCOUNTER — Encounter (INDEPENDENT_AMBULATORY_CARE_PROVIDER_SITE_OTHER): Payer: Self-pay

## 2013-08-23 ENCOUNTER — Other Ambulatory Visit: Payer: Self-pay | Admitting: Internal Medicine

## 2013-08-23 VITALS — BP 112/73 | HR 56 | Temp 98.1°F | Ht 69.0 in | Wt 195.0 lb

## 2013-08-23 DIAGNOSIS — K219 Gastro-esophageal reflux disease without esophagitis: Secondary | ICD-10-CM

## 2013-08-23 DIAGNOSIS — K920 Hematemesis: Secondary | ICD-10-CM

## 2013-08-23 NOTE — Patient Instructions (Signed)
1. We have scheduled you for an upper endoscopy with Dr. Jena Gauss. Please see separate instructions.  2. Continue pantoprazole twice daily for now. 3. Avoid NSAIDs for now. 4. If you have black tarry stools, recurrent hematemesis please go to nearest ER.

## 2013-08-23 NOTE — Progress Notes (Signed)
cc'd to pcp 

## 2013-08-23 NOTE — Assessment & Plan Note (Addendum)
42 year old gentleman with chronic GERD more recently requiring twice a day PPI therapy to adequately control. Recent hematemesis noted. Recent diarrhea which seems to be resolving. Remote significant NSAID use but none in one year. Remote alcohol abuse but limited use of the past 5 years. Recent labs are reassuring. Treated for positive H.pylori serologies recently. Differential diagnosis includes Mallory-Weiss tear, reflux esophagitis, peptic ulcer disease, less likely variceal bleed or malignancy. Recommend upper endoscopy for further evaluation.  I have discussed the risks, alternatives, benefits with regards to but not limited to the risk of reaction to medication, bleeding, infection, perforation and the patient is agreeable to proceed. Written consent to be obtained. Augment conscious sedation with Phenergan 25mg  IV 30 minutes before the procedure given h/o prior heavy etoh use.  Continue pantoprazole 40mg  BID for now. Avoid all NSAIDs. Go to ER if develop recurrent hematemesis/melena.

## 2013-08-23 NOTE — Progress Notes (Signed)
Primary Care Physician:  DIXON,MARY BETH, PA-C  Primary Gastroenterologist:  Michael Rourk, MD   Chief Complaint  Patient presents with  . Gastrophageal Reflux    HPI:  Bryan Khan is a 42 y.o. male here for recent N/V, hematemesis. H.Pylori serologies were positive and he underwent Prevpac treatment. Patient complains of 20 year h/o GERD. Treated with PPI for 5 years, before ate lots of TUMS. Omeprazole stopped working. On pantoprazole more recently. Failed once a day. Lot of belching. Still some breakthrough symptoms on PPI BID. Rare esophageal dysphagia. Recently he woke up from deep sleep with vomiting. No bloody emesis with the first episode. Also noticed a few weeks of loose stools without recent antibiotic use, travel abroad, drinking stream water, ill contacts. Bowel movements seem to be returning to normal at this point. 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 Recent complete physical, Hemoccult negative x1. Used to take 4-5 Goody's powders daily, advil 1000mg daily, mobic, aleve at some point in time but none in over one year.   No prior upper endoscopy.  Current Outpatient Prescriptions  Medication Sig Dispense Refill  . pantoprazole (PROTONIX) 40 MG tablet Take 1 tablet (40 mg total) by mouth 2 (two) times daily.  60 tablet  5   No current facility-administered medications for this visit.    Allergies as of 08/23/2013 - Review Complete 08/23/2013  Allergen Reaction Noted  . Codeine Nausea Only     Past Medical History  Diagnosis Date  . Allergy   . 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 Reflux     Past Surgical History  Procedure Laterality Date  . Fracture surgery      wrist  . Knee arthroscopy      Family History  Problem Relation Age of Onset  . Hyperlipidemia Father   . Anxiety disorder Father   . Aneurysm Cousin   . Hypertension Paternal Uncle   . Diabetes Maternal Grandfather   . Heart disease Maternal Grandfather    . Hypertension Paternal Grandmother   . Cancer Paternal Grandfather     rare blood disease  . Colon cancer Neg Hx     History   Social History  . Marital Status: Single    Spouse Name: N/A    Number of Children: 2  . Years of Education: N/A   Occupational History  . [employer]    Social History Main Topics  . Smoking status: Former Smoker    Quit date: 02/01/1994  . Smokeless tobacco: Never Used     Comment: Quit x 20 years  . 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 glass of wine twice per week  . Drug Use: Yes    Special: Marijuana  . Sexual Activity: Not on file   Other Topics Concern  . Not on file   Social History Narrative  . No narrative on file      ROS:  General: Negative for anorexia, weight loss, fever, chills, fatigue, weakness. Eyes: Negative for vision changes.  ENT: Negative for hoarseness, difficulty swallowing , nasal congestion. CV: Negative for chest pain, angina, palpitations, dyspnea on exertion, peripheral edema.  Respiratory: Negative for dyspnea at rest, dyspnea on exertion, cough, sputum, wheezing.  GI: See history of present illness. GU:  Negative for dysuria, hematuria, urinary incontinence, urinary frequency, nocturnal   urination.  MS: Chronic muscle/joint pain.   Derm: Negative for rash or itching.  Neuro: Negative for weakness, abnormal sensation, seizure, frequent headaches, memory loss, confusion.  Psych: Negative for anxiety, depression, suicidal ideation, hallucinations.  Endo: Negative for unusual weight change.  Heme: Negative for bruising or bleeding. Allergy: Negative for rash or hives.    Physical Examination:  BP 112/73  Pulse 56  Temp(Src) 98.1 F (36.7 C)  Ht 5' 9" (1.753 m)  Wt 195 lb (88.451 kg)  BMI 28.78 kg/m2   General: Well-nourished, well-developed in no acute distress.  Head: Normocephalic,  atraumatic.   Eyes: Conjunctiva pink, no icterus. Mouth: Oropharyngeal mucosa moist and pink , no lesions erythema or exudate. Neck: Supple without thyromegaly, masses, or lymphadenopathy.  Lungs: Clear to auscultation bilaterally.  Heart: Regular rate and rhythm, no murmurs rubs or gallops.  Abdomen: Bowel sounds are normal, nontender, nondistended, no hepatosplenomegaly or masses, no abdominal bruits or    hernia , no rebound or guarding.   Rectal: Not performed Extremities: No lower extremity edema. No clubbing or deformities.  Neuro: Alert and oriented x 4 , grossly normal neurologically.  Skin: Warm and dry, no rash or jaundice.   Psych: Alert and cooperative, normal mood and affect.  Labs: Lab Results  Component Value Date   WBC 9.4 07/26/2013   HGB 15.5 07/26/2013   HCT 43.8 07/26/2013   MCV 86.4 07/26/2013   PLT 197 07/26/2013   Lab Results  Component Value Date   CREATININE 1.12 07/26/2013   BUN 14 07/26/2013   NA 142 07/26/2013   K 4.6 07/26/2013   CL 106 07/26/2013   CO2 26 07/26/2013   Lab Results  Component Value Date   ALT 36 07/26/2013   AST 22 07/26/2013   ALKPHOS 58 07/26/2013   BILITOT 0.7 07/26/2013     Imaging Studies: No results found.    

## 2013-08-24 ENCOUNTER — Ambulatory Visit (HOSPITAL_COMMUNITY)
Admission: RE | Admit: 2013-08-24 | Discharge: 2013-08-24 | Disposition: A | Discharge status: Home / Self Care | Source: Ambulatory Visit | Attending: Internal Medicine | Admitting: Internal Medicine

## 2013-08-24 ENCOUNTER — Encounter (HOSPITAL_COMMUNITY)
Admission: RE | Disposition: A | Discharge status: Home / Self Care | Payer: Self-pay | Source: Ambulatory Visit | Attending: Internal Medicine

## 2013-08-24 DIAGNOSIS — K219 Gastro-esophageal reflux disease without esophagitis: Secondary | ICD-10-CM

## 2013-08-24 DIAGNOSIS — K2100 Gastro-esophageal reflux disease with esophagitis, without bleeding: Secondary | ICD-10-CM | POA: Insufficient documentation

## 2013-08-24 DIAGNOSIS — K449 Diaphragmatic hernia without obstruction or gangrene: Secondary | ICD-10-CM

## 2013-08-24 DIAGNOSIS — K920 Hematemesis: Secondary | ICD-10-CM | POA: Insufficient documentation

## 2013-08-24 DIAGNOSIS — R933 Abnormal findings on diagnostic imaging of other parts of digestive tract: Secondary | ICD-10-CM

## 2013-08-24 HISTORY — PX: ESOPHAGOGASTRODUODENOSCOPY: SHX5428

## 2013-08-24 SURGERY — EGD (ESOPHAGOGASTRODUODENOSCOPY)
Anesthesia: Moderate Sedation

## 2013-08-24 MED ORDER — PROMETHAZINE HCL 25 MG/ML IJ SOLN
25.0000 mg | Freq: Once | INTRAMUSCULAR | Status: AC
  Administered 2013-08-24: 25 mg via INTRAVENOUS

## 2013-08-24 MED ORDER — MIDAZOLAM HCL 5 MG/5ML IJ SOLN
INTRAMUSCULAR | Status: DC | PRN
  Administered 2013-08-24: 1 mg via INTRAVENOUS
  Administered 2013-08-24: 2 mg via INTRAVENOUS

## 2013-08-24 MED ORDER — BUTAMBEN-TETRACAINE-BENZOCAINE 2-2-14 % EX AERO
INHALATION_SPRAY | CUTANEOUS | Status: DC | PRN
  Administered 2013-08-24: 2 via TOPICAL

## 2013-08-24 MED ORDER — STERILE WATER FOR IRRIGATION IR SOLN
Status: DC | PRN
  Administered 2013-08-24: 14:00:00

## 2013-08-24 MED ORDER — ONDANSETRON HCL 4 MG/2ML IJ SOLN
INTRAMUSCULAR | Status: DC | PRN
  Administered 2013-08-24: 4 mg via INTRAVENOUS

## 2013-08-24 MED ORDER — MEPERIDINE HCL 100 MG/ML IJ SOLN
INTRAMUSCULAR | Status: AC
  Filled 2013-08-24: qty 2

## 2013-08-24 MED ORDER — PROMETHAZINE HCL 25 MG/ML IJ SOLN
INTRAMUSCULAR | Status: AC
  Administered 2013-08-24: 25 mg via INTRAVENOUS
  Filled 2013-08-24: qty 1

## 2013-08-24 MED ORDER — SODIUM CHLORIDE 0.9 % IV SOLN
INTRAVENOUS | Status: DC
  Administered 2013-08-24: 1000 mL via INTRAVENOUS

## 2013-08-24 MED ORDER — SODIUM CHLORIDE 0.9 % IJ SOLN
INTRAMUSCULAR | Status: AC
  Filled 2013-08-24: qty 10

## 2013-08-24 MED ORDER — MIDAZOLAM HCL 5 MG/5ML IJ SOLN
INTRAMUSCULAR | Status: AC
  Filled 2013-08-24: qty 10

## 2013-08-24 MED ORDER — MEPERIDINE HCL 100 MG/ML IJ SOLN
INTRAMUSCULAR | Status: DC | PRN
  Administered 2013-08-24: 50 mg via INTRAVENOUS

## 2013-08-24 MED ORDER — ONDANSETRON HCL 4 MG/2ML IJ SOLN
INTRAMUSCULAR | Status: AC
  Filled 2013-08-24: qty 2

## 2013-08-24 NOTE — Interval H&P Note (Signed)
History and Physical Interval Note:  08/24/2013 2:16 PM  Bryan Khan  has presented today for surgery, with the diagnosis of GERD AND Hematemesis  The various methods of treatment have been discussed with the patient and family. After consideration of risks, benefits and other options for treatment, the patient has consented to  Procedure(s) with comments: ESOPHAGOGASTRODUODENOSCOPY (EGD) (N/A) - 3:30 as a surgical intervention .  The patient's history has been reviewed, patient examined, no change in status, stable for surgery.  I have reviewed the patient's chart and labs.  Questions were answered to the patient's satisfaction.     Robert Rourk  No change. EGD per plan.The risks, benefits, limitations, alternatives and imponderables have been reviewed with the patient. Potential for esophageal dilation, biopsy, etc. have also been reviewed.  Questions have been answered. All parties agreeable.

## 2013-08-24 NOTE — H&P (View-Only) (Signed)
Primary Care Physician:  Frazier Richards, PA-C  Primary Gastroenterologist:  Roetta Sessions, MD   Chief Complaint  Patient presents with  . Gastrophageal Reflux    HPI:  Bryan Khan is a 42 y.o. male here for recent N/V, hematemesis. H.Pylori serologies were positive and he underwent Prevpac treatment. Patient complains of 20 year h/o GERD. Treated with PPI for 5 years, before ate lots of TUMS. Omeprazole stopped working. On pantoprazole more recently. Failed once a day. Lot of belching. Still some breakthrough symptoms on PPI BID. Rare esophageal dysphagia. Recently he woke up from deep sleep with vomiting. No bloody emesis with the first episode. Also noticed a few weeks of loose stools without recent antibiotic use, travel abroad, drinking stream water, ill contacts. Bowel movements seem to be returning to normal at this point. 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 Recent complete physical, Hemoccult negative x1. Used to take 4-5 Goody's powders daily, advil 1000mg  daily, mobic, aleve at some point in time but none in over one year.   No prior upper endoscopy.  Current Outpatient Prescriptions  Medication Sig Dispense Refill  . pantoprazole (PROTONIX) 40 MG tablet Take 1 tablet (40 mg total) by mouth 2 (two) times daily.  60 tablet  5   No current facility-administered medications for this visit.    Allergies as of 08/23/2013 - Review Complete 08/23/2013  Allergen Reaction Noted  . Codeine Nausea Only     Past Medical History  Diagnosis Date  . Allergy   . 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 Reflux     Past Surgical History  Procedure Laterality Date  . Fracture surgery      wrist  . Knee arthroscopy      Family History  Problem Relation Age of Onset  . Hyperlipidemia Father   . Anxiety disorder Father   . Aneurysm Cousin   . Hypertension Paternal Uncle   . Diabetes Maternal Grandfather   . Heart disease Maternal Grandfather    . Hypertension Paternal Grandmother   . Cancer Paternal Grandfather     rare blood disease  . Colon cancer Neg Hx     History   Social History  . Marital Status: Single    Spouse Name: N/A    Number of Children: 2  . Years of Education: N/A   Occupational History  . [employer]    Social History Main Topics  . Smoking status: Former Smoker    Quit date: 02/01/1994  . Smokeless tobacco: Never Used     Comment: Quit x 20 years  . 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 glass of wine twice per week  . Drug Use: Yes    Special: Marijuana  . Sexual Activity: Not on file   Other Topics Concern  . Not on file   Social History Narrative  . No narrative on file      ROS:  General: Negative for anorexia, weight loss, fever, chills, fatigue, weakness. Eyes: Negative for vision changes.  ENT: Negative for hoarseness, difficulty swallowing , nasal congestion. CV: Negative for chest pain, angina, palpitations, dyspnea on exertion, peripheral edema.  Respiratory: Negative for dyspnea at rest, dyspnea on exertion, cough, sputum, wheezing.  GI: See history of present illness. GU:  Negative for dysuria, hematuria, urinary incontinence, urinary frequency, nocturnal  urination.  MS: Chronic muscle/joint pain.   Derm: Negative for rash or itching.  Neuro: Negative for weakness, abnormal sensation, seizure, frequent headaches, memory loss, confusion.  Psych: Negative for anxiety, depression, suicidal ideation, hallucinations.  Endo: Negative for unusual weight change.  Heme: Negative for bruising or bleeding. Allergy: Negative for rash or hives.    Physical Examination:  BP 112/73  Pulse 56  Temp(Src) 98.1 F (36.7 C)  Ht 5\' 9"  (1.753 m)  Wt 195 lb (88.451 kg)  BMI 28.78 kg/m2   General: Well-nourished, well-developed in no acute distress.  Head: Normocephalic,  atraumatic.   Eyes: Conjunctiva pink, no icterus. Mouth: Oropharyngeal mucosa moist and pink , no lesions erythema or exudate. Neck: Supple without thyromegaly, masses, or lymphadenopathy.  Lungs: Clear to auscultation bilaterally.  Heart: Regular rate and rhythm, no murmurs rubs or gallops.  Abdomen: Bowel sounds are normal, nontender, nondistended, no hepatosplenomegaly or masses, no abdominal bruits or    hernia , no rebound or guarding.   Rectal: Not performed Extremities: No lower extremity edema. No clubbing or deformities.  Neuro: Alert and oriented x 4 , grossly normal neurologically.  Skin: Warm and dry, no rash or jaundice.   Psych: Alert and cooperative, normal mood and affect.  Labs: Lab Results  Component Value Date   WBC 9.4 07/26/2013   HGB 15.5 07/26/2013   HCT 43.8 07/26/2013   MCV 86.4 07/26/2013   PLT 197 07/26/2013   Lab Results  Component Value Date   CREATININE 1.12 07/26/2013   BUN 14 07/26/2013   NA 142 07/26/2013   K 4.6 07/26/2013   CL 106 07/26/2013   CO2 26 07/26/2013   Lab Results  Component Value Date   ALT 36 07/26/2013   AST 22 07/26/2013   ALKPHOS 58 07/26/2013   BILITOT 0.7 07/26/2013     Imaging Studies: No results found.

## 2013-08-24 NOTE — Progress Notes (Signed)
Phenergan 25 mg given  Slow IV push. Tolerated well. IV site WNL.

## 2013-08-24 NOTE — Op Note (Signed)
[facility] [address] [place], [address]   ENDOSCOPY PROCEDURE REPORT  PATIENT: Bryan Khan, Bryan Khan.  MR#: [id number] BIRTHDATE: 01/23/1971 , 42  yrs. old GENDER: Male ENDOSCOPIST: R.  Roetta Sessions, MD [facility] REFERRED BY:  Frazier Richards, [place] PROCEDURE DATE:  08/24/2013 PROCEDURE:     EGD with biopsy  INDICATIONS:     Long-standing GERD symptoms-refractory to PPI therapy  INFORMED CONSENT:   The risks, benefits, limitations, alternatives and imponderables have been discussed.  The potential for biopsy, esophogeal dilation, etc. have also been reviewed.  Questions have been answered.  All parties agreeable.  Please see the history and physical in the medical record for more information.  MEDICATIONS: Versed 3 mg IV and Demerol 50 mg IV in divided doses. Cetacaine spray. Zofran 4 mg IV.  DESCRIPTION OF PROCEDURE:   The EG-[id number] ([id number])  endoscope was introduced through the mouth and advanced to the second portion of the duodenum without difficulty or limitations.  The mucosal surfaces were surveyed very carefully during advancement of the scope and upon withdrawal.  Retroflexion view of the proximal stomach and esophagogastric junction was performed.      FINDINGS:    Four-quadrant distal esophageal erosions. Patulous EG junction. Accentuated undulating Z line versus short segment Barrett's esophagus. No tumor. Stomach empty. 3 cm hiatal hernia. Normal gastric mucosa. Patent pylorus. Normal first and second portion of the duodenum.  THERAPEUTIC / DIAGNOSTIC MANEUVERS PERFORMED:  Biopsies of the distal esophagus taken.   COMPLICATIONS:  None  IMPRESSION:   Erosive reflux esophagitis. Abnormal distal esophagus - query short segment Barrett's-status post biopsy. Patulous EG junction. Hiatal hernia.  RECOMMENDATIONS:   Stop pantoprazole; begin Dexilant 60 mg daily. GERD literature provided. Followup on pathology. Office visit in  6 weeks    _______________________________ R. Roetta Sessions, MD FACP [facility] eSigned:  R. Roetta Sessions, MD FACP [facility] 08/24/2013 2:41 PM     CC:

## 2013-08-29 ENCOUNTER — Encounter (HOSPITAL_COMMUNITY): Payer: Self-pay | Admitting: Internal Medicine

## 2013-09-02 ENCOUNTER — Encounter: Payer: Self-pay | Admitting: Internal Medicine

## 2013-09-25 ENCOUNTER — Telehealth: Payer: Self-pay | Admitting: Internal Medicine

## 2013-09-25 NOTE — Telephone Encounter (Signed)
Spoke with LSL- pt has tried omeprazole, pantoprazole, but has not tried nexium. pts insurance will not pay for dexilant until pt has tried all three. Pt agreeable to try nexium. Gave #20 samples of nexium and he will call us in 3 weeks and let us know how it is working for him. If its not working we will redo PA for dexilant. Pt stated dexilant was working good.

## 2013-09-25 NOTE — Telephone Encounter (Signed)
Pt seen recently and stopped by office window today. His insurance isn't covering Dexilant and he has tried everything, but Nexium. I told him that I would see if we could switch him to something else that his insurance would cover and also sent JL a note to see if we could give him some samples.

## 2013-09-25 NOTE — Telephone Encounter (Signed)
Noted  

## 2013-09-28 ENCOUNTER — Encounter: Payer: Self-pay | Admitting: Family Medicine

## 2013-09-28 ENCOUNTER — Ambulatory Visit (INDEPENDENT_AMBULATORY_CARE_PROVIDER_SITE_OTHER): Admitting: Family Medicine

## 2013-09-28 VITALS — BP 110/76 | HR 64 | Temp 98.3°F | Resp 12 | Ht 69.0 in | Wt 197.0 lb

## 2013-09-28 DIAGNOSIS — M7711 Lateral epicondylitis, right elbow: Secondary | ICD-10-CM

## 2013-09-28 DIAGNOSIS — M7710 Lateral epicondylitis, unspecified elbow: Secondary | ICD-10-CM

## 2013-09-28 NOTE — Progress Notes (Signed)
   Subjective:    Patient ID: Bryan Khan, male    DOB: 1971/05/01, 43 y.o.   MRN: [id number]  HPI  Reports 1 month of right elbow pain situated at the right lateral epicondyle.,   Pain is worse gripping objects and with wrist dorsiflexion and extension.  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  Denies any sudden injury.  Cannot tolerate NSAIDs. Past Medical History  Diagnosis Date  . Allergy   . 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 Reflux    No current outpatient prescriptions on file prior to visit.   No current facility-administered medications on file prior to visit.   Allergies  Allergen Reactions  . Codeine Nausea Only   History   Social History  . Marital Status: Married    Spouse Name: N/A    Number of Children: 2  . Years of Education: N/A   Occupational History  . [employer]    Social History Main Topics  . Smoking status: Former Smoker    Quit date: 02/01/1994  . Smokeless tobacco: Never Used     Comment: Quit x 20 years  . 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 glass of wine twice per week  . Drug Use: Yes    Special: Marijuana  . Sexual Activity: Not on file   Other Topics Concern  . Not on file   Social History Narrative  . No narrative on file     Review of Systems  All other systems reviewed and are negative.       Objective:   Physical Exam  Vitals reviewed. Cardiovascular: Normal rate and regular rhythm.   Pulmonary/Chest: Effort normal and breath sounds normal.  Musculoskeletal:       Right elbow: He exhibits normal range of motion, no swelling, no deformity and no laceration. Tenderness found. Lateral epicondyle tenderness noted. No radial head, no medial epicondyle and no olecranon process tenderness noted.          Assessment & Plan:  1. Lateral epicondylitis (tennis elbow), right Discussed options for management,  patient elects to receive cortisone injection.  Using sterile technique I injected a mixture of 1 cc of lidocaine and 1 cc of 40 mg/ml Kenalog just distal to the right lateral epicondyle.  I also gave the patient an elbow strap and recommended rest for 2 weeks.  Recheck in 2 weeks or sooner if worse.

## 2013-10-12 ENCOUNTER — Other Ambulatory Visit: Payer: Self-pay | Admitting: Internal Medicine

## 2013-10-12 MED ORDER — ESOMEPRAZOLE MAGNESIUM 40 MG PO CPDR: 40.0000 mg | DELAYED_RELEASE_CAPSULE | Freq: Every day | ORAL | Status: DC

## 2013-10-12 NOTE — Telephone Encounter (Signed)
Pt needs a Nexium refill called into [place] pharmacy today. He has enough to last through tomorrow.

## 2013-12-31 ENCOUNTER — Other Ambulatory Visit: Payer: Self-pay | Admitting: Physician Assistant

## 2013-12-31 ENCOUNTER — Other Ambulatory Visit (INDEPENDENT_AMBULATORY_CARE_PROVIDER_SITE_OTHER)

## 2013-12-31 DIAGNOSIS — Z0184 Encounter for antibody response examination: Secondary | ICD-10-CM

## 2013-12-31 DIAGNOSIS — Z23 Encounter for immunization: Secondary | ICD-10-CM | POA: Diagnosis not present

## 2013-12-31 NOTE — Addendum Note (Signed)
Addended by: WRAY, [place] on: 12/31/2013 11:19 AM   Modules accepted: Orders

## 2014-01-01 LAB — VARICELLA ZOSTER ANTIBODY, IGG: VARICELLA IGG: 493.5 {index} — AB (ref <135.00)

## 2014-02-18 HISTORY — PX: ORIF FIBULA FRACTURE: SHX2121

## 2014-02-18 HISTORY — PX: ORIF TIBIAL SHAFT FRACTURE W/ PLATES AND SCREWS: SUR964

## 2014-05-21 ENCOUNTER — Ambulatory Visit (INDEPENDENT_AMBULATORY_CARE_PROVIDER_SITE_OTHER): Admitting: Family Medicine

## 2014-05-21 ENCOUNTER — Encounter: Payer: Self-pay | Admitting: Family Medicine

## 2014-05-21 VITALS — BP 118/60 | HR 80 | Temp 98.4°F | Resp 18 | Wt 182.0 lb

## 2014-05-21 DIAGNOSIS — G9050 Complex regional pain syndrome I, unspecified: Secondary | ICD-10-CM

## 2014-05-21 MED ORDER — GABAPENTIN 300 MG PO CAPS: 300.0000 mg | ORAL_CAPSULE | Freq: Three times a day (TID) | ORAL | Status: DC

## 2014-05-21 MED ORDER — HYDROCODONE-ACETAMINOPHEN 10-325 MG PO TABS: 1.0000 | ORAL_TABLET | Freq: Three times a day (TID) | ORAL | Status: DC | PRN
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
## 2014-05-28 ENCOUNTER — Encounter: Payer: Self-pay | Admitting: Family Medicine

## 2014-05-28 ENCOUNTER — Ambulatory Visit (INDEPENDENT_AMBULATORY_CARE_PROVIDER_SITE_OTHER): Admitting: Family Medicine

## 2014-05-28 VITALS — BP 132/76 | HR 60 | Temp 98.0°F | Resp 12 | Ht 71.0 in | Wt 185.0 lb

## 2014-05-28 DIAGNOSIS — T148 Other injury of unspecified body region: Secondary | ICD-10-CM

## 2014-05-28 DIAGNOSIS — W57XXXA Bitten or stung by nonvenomous insect and other nonvenomous arthropods, initial encounter: Secondary | ICD-10-CM

## 2014-05-28 MED ORDER — PREDNISONE 10 MG PO TABS: ORAL_TABLET | ORAL | Status: DC

## 2014-05-28 MED ORDER — METHYLPREDNISOLONE ACETATE 40 MG/ML IJ SUSP
40.0000 mg | Freq: Once | INTRAMUSCULAR | Status: AC
  Administered 2014-05-28: 40 mg via INTRAMUSCULAR

## 2014-05-28 MED ORDER — DOXYCYCLINE HYCLATE 100 MG PO TABS: 100.0000 mg | ORAL_TABLET | Freq: Two times a day (BID) | ORAL | Status: DC

## 2014-05-28 NOTE — Patient Instructions (Signed)
Take antibiotics as prescribed Start the prednisone by mouth tomorrow F/U as needed

## 2014-05-28 NOTE — Progress Notes (Signed)
Patient ID: Bryan Khan, male   DOB: 07/05/1971, 43 y.o.   MRN: [id number]   Subjective:    Patient ID: Bryan Khan, male    DOB: [birth year], 43 y.o.   MRN: [id number]  Patient presents for Tick Bites  patient here with multiple tick bites he has had this before in the past when he was out in the woods. He was out walking in the woods on Friday near his home he believes that he stepped and then passed a small takes he actually pulled about 30 takes off of his lower Cervone's. Initially started as a red bumpy rash on his ankles but is now proceeded all the way up his legs and less than 24 hours he then noticed some blistering with some yellow crusting and oozing the past couple of days. He's not had any fever no joint pain. He has been using Aveeno bath and a topical for itching which has not helped    Review Of Systems:  GEN- denies fatigue, fever, weight loss,weakness, recent illness HEENT- denies eye drainage, change in vision, nasal discharge, CVS- denies chest pain, palpitations RESP- denies SOB, cough, wheeze ABD- denies N/V, change in stools, abd pain GU- denies dysuria, hematuria, dribbling, incontinence MSK- denies joint pain, muscle aches, injury Neuro- denies headache, dizziness, syncope, seizure activity       Objective:    BP 132/76  Pulse 60  Temp(Src) 98 F (36.7 C) (Oral)  Resp 12  Ht  (1.803 m)  Wt 185 lb (83.915 kg)  BMI 25.81 kg/m2 GEN- NAD, alert and oriented x3 Skin- numerous erythematous maculopapular lesions from ankles  up to lower abdomen, mostly open, few  Blisters draining clear/yellow fluid with some honey crusting, +exoriations Ext- no edema       Assessment & Plan:      Problem List Items Addressed This Visit   None    Visit Diagnoses   Tick bites    -  Primary    He has multiple infected tick bite seems like he may have stepped in a patch that had some in younger stage, treat with Doxy x 10 days, Depo-Medrol given, steroid  taper    Relevant Medications       methylPREDNISolone acetate (DEPO-MEDROL) injection 40 mg (Completed)       Note: This dictation was prepared with Dragon dictation along with smaller phrase technology. Any transcriptional errors that result from this process are unintentional.

## 2014-06-14 ENCOUNTER — Telehealth: Payer: Self-pay | Admitting: Physician Assistant

## 2014-06-14 DIAGNOSIS — G9050 Complex regional pain syndrome I, unspecified: Secondary | ICD-10-CM

## 2014-06-14 NOTE — Telephone Encounter (Signed)
Received call from pharmacist at [facility].   Reports that patient has requested refill on Norco, but he does not have enough to get him through the weekend.   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 Reports increase in pain with rainy weather and doubts that he will be able to complete training maneuvers without some medication.   Dr. Jeanice Lim made aware and prescribed Tramadol  (1) tab PO q 6hours PRN pain, #20 with no refills.   MD please advise.

## 2014-06-14 NOTE — Telephone Encounter (Signed)
Last Rf Hydrocodone 05/21/14 #30.  OK refill?  ??? Gabapentin

## 2014-06-14 NOTE — Telephone Encounter (Signed)
[phone]  PT was given    gabapentin (NEURONTIN) 300 MG capsule And it started out it seemed it like it was getting better and past few weeks it has not got any better.    PT is needing a refill on  HYDROcodone-acetaminophen (NORCO) 10-325 MG per tablet

## 2014-06-17 ENCOUNTER — Telehealth: Payer: Self-pay | Admitting: *Deleted

## 2014-06-17 MED ORDER — HYDROCODONE-ACETAMINOPHEN 10-325 MG PO TABS: 1.0000 | ORAL_TABLET | Freq: Three times a day (TID) | ORAL | Status: DC | PRN

## 2014-06-17 NOTE — Telephone Encounter (Signed)
Pt came in office today to pick up his prescription Norco, while here he stated that he is taking gabapentin  and has tried for couple weeks and states he does not feel any effects from it at all, however, says he does get headaches after he takes the medications but not sure if its coming from medication or allergies. But wants to know if you are going to increase his dose? Please advise  [facility] pharmacy

## 2014-06-17 NOTE — Telephone Encounter (Signed)
I would like to switch patient to lyrica 75 mg po tid prn leg pain and d/c gabapentin

## 2014-06-17 NOTE — Telephone Encounter (Signed)
Prescription printed and patient made aware to come to office to pick up per VM.  

## 2014-06-17 NOTE — Telephone Encounter (Signed)
Ok to refill norco

## 2014-06-18 NOTE — Telephone Encounter (Signed)
LMTRC

## 2014-06-19 NOTE — Telephone Encounter (Signed)
LMTRC

## 2014-06-25 NOTE — Telephone Encounter (Signed)
LMTRC

## 2014-06-29 MED ORDER — PREGABALIN 75 MG PO CAPS: 75.0000 mg | ORAL_CAPSULE | Freq: Three times a day (TID) | ORAL | Status: DC | PRN

## 2014-06-29 NOTE — Telephone Encounter (Signed)
Call placed to patient and patient made aware per VM.   Medication called to pharmacy. 

## 2014-07-05 ENCOUNTER — Telehealth: Payer: Self-pay | Admitting: *Deleted

## 2014-07-05 NOTE — Telephone Encounter (Signed)
Received fax requesting PA for Lyrica.   PA submitted.

## 2014-07-11 ENCOUNTER — Telehealth: Payer: Self-pay | Admitting: Physician Assistant

## 2014-07-11 NOTE — Telephone Encounter (Signed)
We have initiated the PA for the Lyrica and are waiting on answer.

## 2014-07-11 NOTE — Telephone Encounter (Signed)
Patient is calling to say that his tricare is needing preauthorization on his lyrica or does he need to switch to something different? And also is requesting rx for hydrocodone  Please call [phone] when ready and to talk to him about the lyrica

## 2014-07-12 MED ORDER — TRAMADOL HCL 50 MG PO TABS: 50.0000 mg | ORAL_TABLET | Freq: Three times a day (TID) | ORAL | Status: DC | PRN

## 2014-07-12 NOTE — Telephone Encounter (Signed)
Pt aware of recommendation and willing to try Tramadol also aware that PA has been initiated for the Lyrica. Tramadol called to pharm.

## 2014-07-24 NOTE — Telephone Encounter (Signed)
Received PA determination.   PA approved.  

## 2014-11-11 ENCOUNTER — Other Ambulatory Visit: Payer: Self-pay | Admitting: Gastroenterology

## 2015-01-27 ENCOUNTER — Telehealth: Payer: Self-pay | Admitting: Physician Assistant

## 2015-01-27 NOTE — Telephone Encounter (Signed)
Patient has some questions about his immunizations  Please call him at [phone]

## 2015-01-27 NOTE — Telephone Encounter (Signed)
Pt wanted to know dates of MMR vaccinations.  Dates were given and copy of [facility] report made for him.  He is going to come by office and pick up.

## 2015-06-04 ENCOUNTER — Ambulatory Visit (INDEPENDENT_AMBULATORY_CARE_PROVIDER_SITE_OTHER): Admitting: Physician Assistant

## 2015-06-04 ENCOUNTER — Encounter: Payer: Self-pay | Admitting: Physician Assistant

## 2015-06-04 VITALS — BP 120/80 | HR 56 | Temp 98.7°F | Resp 16 | Wt 197.0 lb

## 2015-06-04 DIAGNOSIS — N481 Balanitis: Secondary | ICD-10-CM

## 2015-06-04 MED ORDER — FLUCONAZOLE 150 MG PO TABS: 150.0000 mg | ORAL_TABLET | Freq: Once | ORAL | Status: DC

## 2015-06-04 MED ORDER — CLOTRIMAZOLE 1 % EX CREA: 1.0000 "application " | TOPICAL_CREAM | Freq: Two times a day (BID) | CUTANEOUS | Status: DC

## 2015-06-04 NOTE — Progress Notes (Signed)
    Patient ID: Bryan Khan MRN: [id number], DOB: 27-Apr-1971, 44 y.o. Date of Encounter: 06/04/2015, 1:00 PM    Chief Complaint:  Chief Complaint  Patient presents with  . Dry white flakey rash on penis     HPI: 44 y.o. year old white male presents with above.   Says that this has been going on for more than a month. Says that he was seeing spots at the tip of his penis. Says they started out red but then they will turn white. Says that they have now started to appear down the skin on the back of his shaft of his penis. When has erections, develops cracks.  Says there are no raised lesions.      Home Meds:   Outpatient Prescriptions Prior to Visit  Medication Sig Dispense Refill  . NEXIUM 40 MG capsule TAKE ONE CAPSULE BY MOUTH DAILY BEFORE BREAKFAST 30 capsule 11  . doxycycline (VIBRA-TABS) 100 MG tablet Take 1 tablet (100 mg total) by mouth 2 (two) times daily. 20 tablet 0  . HYDROcodone-acetaminophen (NORCO) 10-325 MG per tablet Take 1 tablet by mouth every 8 (eight) hours as needed. 30 tablet 0  . predniSONE (DELTASONE) 10 MG tablet Take  x 3 days,20mg  x 3 days,  x 3 days 21 tablet 0  . pregabalin (LYRICA) 75 MG capsule Take 1 capsule (75 mg total) by mouth 3 (three) times daily as needed. 90 capsule 0  . traMADol (ULTRAM) 50 MG tablet Take 1 tablet (50 mg total) by mouth every 8 (eight) hours as needed. 30 tablet 0   No facility-administered medications prior to visit.    Allergies:  Allergies  Allergen Reactions  . Codeine Nausea Only      Review of Systems: See HPI for pertinent ROS. All other ROS negative.    Physical Exam: Blood pressure 120/80, pulse 56, temperature 98.7 F (37.1 C), temperature source Oral, resp. rate 16, weight 197 lb (89.359 kg)., Body mass index is 27.49 kg/(m^2). General:  WNWD WM. Appears in no acute distress. Neck: Supple. No thyromegaly. No lymphadenopathy. Lungs: Clear bilaterally to auscultation without wheezes, rales, or  rhonchi. Breathing is unlabored. Heart: Regular rhythm. No murmurs, rubs, or gallops. Msk:  Strength and tone normal for age. Extremities/Skin: Exam deferred. Neuro: Alert and oriented X 3. Moves all extremities spontaneously. Gait is normal. CNII-XII grossly in tact. Psych:  Responds to questions appropriately with a normal affect.     ASSESSMENT AND PLAN:  44 y.o. year old male with  1. Balanitis Take oral Diflucan 1 and apply Lotrimin cream twice a day. If does not improve/resolve in 1-2 weeks, then follow-up. - fluconazole (DIFLUCAN) 150 MG tablet; Take 1 tablet (150 mg total) by mouth once.  Dispense: 1 tablet; Refill: 0 - clotrimazole (LOTRIMIN) 1 % cream; Apply 1 application topically 2 (two) times daily.  Dispense: 30 g; Refill: 0   Signed, [address] [place], [place], [facility] 06/04/2015 1:00 PM

## 2015-07-14 ENCOUNTER — Encounter: Payer: Self-pay | Admitting: Family Medicine

## 2015-07-14 ENCOUNTER — Ambulatory Visit (INDEPENDENT_AMBULATORY_CARE_PROVIDER_SITE_OTHER): Admitting: Family Medicine

## 2015-07-14 VITALS — BP 104/70 | HR 56 | Temp 98.0°F | Resp 14 | Ht 69.0 in | Wt 199.0 lb

## 2015-07-14 DIAGNOSIS — Z0000 Encounter for general adult medical examination without abnormal findings: Secondary | ICD-10-CM | POA: Diagnosis not present

## 2015-07-14 DIAGNOSIS — Z23 Encounter for immunization: Secondary | ICD-10-CM
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
## 2015-07-15 LAB — COMPLETE METABOLIC PANEL WITH GFR
ALBUMIN: 4.4 g/dL (ref 3.6–5.1)
ALT: 30 U/L (ref 9–46)
AST: 24 U/L (ref 10–40)
Alkaline Phosphatase: 65 U/L (ref 40–115)
BILIRUBIN TOTAL: 0.5 mg/dL (ref 0.2–1.2)
BUN: 16 mg/dL (ref 7–25)
CALCIUM: 9.4 mg/dL (ref 8.6–10.3)
CO2: 23 mmol/L (ref 20–31)
Chloride: 106 mmol/L (ref 98–110)
Creat: 0.93 mg/dL (ref 0.60–1.35)
GFR, Est African American: >89 mL/min (ref >=60)
GFR, Est Non African American: >89 mL/min (ref >=60)
GLUCOSE: 85 mg/dL (ref 70–99)
POTASSIUM: 3.9 mmol/L (ref 3.5–5.3)
SODIUM: 140 mmol/L (ref 135–146)
TOTAL PROTEIN: 7.1 g/dL (ref 6.1–8.1)

## 2015-07-15 LAB — CBC WITH DIFFERENTIAL/PLATELET
Basophils Absolute: 0.1 10*3/uL (ref 0.0–0.1)
Basophils Relative: 1 % (ref 0–1)
Eosinophils Absolute: 0.2 10*3/uL (ref 0.0–0.7)
Eosinophils Relative: 3 % (ref 0–5)
HEMATOCRIT: 40.7 % (ref 39.0–52.0)
HEMOGLOBIN: 14.3 g/dL (ref 13.0–17.0)
LYMPHS PCT: 35 % (ref 12–46)
Lymphs Abs: 2.7 10*3/uL (ref 0.7–4.0)
MCH: 30.2 pg (ref 26.0–34.0)
MCHC: 35.1 g/dL (ref 30.0–36.0)
MCV: 85.9 fL (ref 78.0–100.0)
MONO ABS: 0.5 10*3/uL (ref 0.1–1.0)
MPV: 11.6 fL (ref 8.6–12.4)
Monocytes Relative: 7 % (ref 3–12)
NEUTROS ABS: 4.2 10*3/uL (ref 1.7–7.7)
NEUTROS PCT: 54 % (ref 43–77)
Platelets: 202 10*3/uL (ref 150–400)
RBC: 4.74 MIL/uL (ref 4.22–5.81)
RDW: 13 % (ref 11.5–15.5)
WBC: 7.8 10*3/uL (ref 4.0–10.5)

## 2015-07-15 LAB — LIPID PANEL
Cholesterol: 157 mg/dL (ref 125–200)
HDL: 57 mg/dL (ref >=40)
LDL CALC: 82 mg/dL (ref <130)
TRIGLYCERIDES: 88 mg/dL (ref <150)
Total CHOL/HDL Ratio: 2.8 Ratio (ref <=5.0)
VLDL: 18 mg/dL (ref <30)

## 2015-07-16 ENCOUNTER — Encounter: Payer: Self-pay | Admitting: Family Medicine

## 2015-12-04 ENCOUNTER — Other Ambulatory Visit: Payer: Self-pay | Admitting: Gastroenterology

## 2015-12-04 NOTE — Telephone Encounter (Signed)
Please notify the patient that he has not been seen in over 2 years. I can send in a limited supply to last a couple months to allow him time to schedule a follow-up per office policy.

## 2015-12-05 NOTE — Telephone Encounter (Signed)
Stacey, please schedule pt. 

## 2015-12-08 ENCOUNTER — Encounter: Payer: Self-pay | Admitting: Internal Medicine

## 2015-12-08 NOTE — Telephone Encounter (Signed)
APPT MADE AND LETTER SENT  °

## 2015-12-25 ENCOUNTER — Encounter: Payer: Self-pay | Admitting: Nurse Practitioner

## 2015-12-25 ENCOUNTER — Ambulatory Visit (INDEPENDENT_AMBULATORY_CARE_PROVIDER_SITE_OTHER): Admitting: Nurse Practitioner

## 2015-12-25 VITALS — BP 123/80 | HR 49 | Temp 97.6°F | Ht 68.0 in | Wt 190.0 lb

## 2015-12-25 DIAGNOSIS — K2100 Gastro-esophageal reflux disease with esophagitis, without bleeding: Secondary | ICD-10-CM

## 2015-12-25 DIAGNOSIS — K625 Hemorrhage of anus and rectum: Secondary | ICD-10-CM | POA: Diagnosis not present

## 2015-12-25 MED ORDER — NEXIUM 40 MG PO CPDR: DELAYED_RELEASE_CAPSULE | ORAL | Status: DC

## 2015-12-25 NOTE — Assessment & Plan Note (Signed)
History of GERD with erosive reflux esophagitis on Protonix. Has been on Nexium which controls his symptoms quite well, has a pretty rapid return of symptoms if he misses more than a dose or 2 of his PPI. His currently out, taking samples. Refill Nexium today. Return for follow-up as needed. He is concerned about possible bone density loss in the future, recommended he discuss possibility of DEXA scan with his primary care provider.

## 2015-12-25 NOTE — Patient Instructions (Signed)
1. Return to stool test to our office when you complete it. 2. I send a refill of her Nexium to the pharmacy. 3. Discuss the bone scan with your primary care provider if you're concerned about bone mineral loss with Nexium. 4. Return for follow-up as needed.

## 2015-12-25 NOTE — Progress Notes (Signed)
cc'ed to pcp °

## 2015-12-25 NOTE — Assessment & Plan Note (Signed)
Rare, small spot toilet tissue hematochezia. Likely internal hemorrhoids. He is hesitant to evaluate extensively, is agreeable to an iFOBT test. We will send this home with him to return to our office. If positive can discuss further options and recommendations. Return for follow-up as needed.
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
## 2016-01-19 ENCOUNTER — Ambulatory Visit: Admitting: Gastroenterology

## 2016-01-28 ENCOUNTER — Encounter: Payer: Self-pay | Admitting: Physician Assistant

## 2016-01-28 ENCOUNTER — Ambulatory Visit (INDEPENDENT_AMBULATORY_CARE_PROVIDER_SITE_OTHER): Admitting: Physician Assistant

## 2016-01-28 VITALS — BP 124/80 | HR 52 | Temp 98.3°F | Resp 18 | Wt 191.0 lb

## 2016-01-28 DIAGNOSIS — J020 Streptococcal pharyngitis: Secondary | ICD-10-CM

## 2016-01-28 DIAGNOSIS — J029 Acute pharyngitis, unspecified: Secondary | ICD-10-CM

## 2016-01-28 LAB — STREP GROUP A AG, W/REFLEX TO CULT: STREGTOCOCCUS GROUP A AG SCREEN: DETECTED — AB

## 2016-01-28 MED ORDER — AMOXICILLIN-POT CLAVULANATE 875-125 MG PO TABS: 1.0000 | ORAL_TABLET | Freq: Two times a day (BID) | ORAL | Status: DC

## 2016-01-28 NOTE — Progress Notes (Signed)
    Patient ID: Bryan Khan J Sahli MRN: [id number], DOB: [birth year], 45 y.o. Date of Encounter: 01/28/2016, 10:06 AM    Chief Complaint:  Chief Complaint  Patient presents with  . sore throat    whole rt side head hurts, swollen glands, wife was + strep     HPI: 45 y.o. year old white male presents with above.   Says that he had root canal.  XRay then showed infection up in there--Given Amoxicillin + 1 refill.  Then had further imaging---was Given Clindamycin   Pt says that about 2 weeks ago he had ST and saw white spots back there--filled other refil on amoxicilalin-- took it for complete 10 days. Symptoms resolved withing 24-48 hours of amox.  Says wife recently had + strep test and she is currently on antibiotics.   Pt now with sore throat.   Says he is "still having tooth pain and if there's something I could give him that would possibly treat both --throat and tooth--that would be great"      Home Meds:   Outpatient Prescriptions Prior to Visit  Medication Sig Dispense Refill  . NEXIUM 40 MG capsule TAKE ONE (1) CAPSULE EACH DAY BEFORE BREAKFAST 30 capsule 11   No facility-administered medications prior to visit.    Allergies:  Allergies  Allergen Reactions  . Codeine Nausea Only      Review of Systems: See HPI for pertinent ROS. All other ROS negative.    Physical Exam: Blood pressure 124/80, pulse 52, temperature 98.3 F (36.8 C), temperature source Oral, resp. rate 18, weight 191 lb (86.637 kg)., Body mass index is 29.05 kg/(m^2). General:  WNWD WM. Appears in no acute distress. HEENT: Normocephalic, atraumatic, eyes without discharge, sclera non-icteric, nares are without discharge. Bilateral auditory canals clear, TM's are without perforation, pearly grey and translucent with reflective cone of light bilaterally. Oral cavity moist, posterior pharynx and tonsils with moderate erythema. No exudate. No peritonsillar abscess.  Neck: Supple. No thyromegaly. No  lymphadenopathy. Lungs: Clear bilaterally to auscultation without wheezes, rales, or rhonchi. Breathing is unlabored. Heart: Regular rhythm. No murmurs, rubs, or gallops. Extremities/Skin: Warm and dry. No rashes. Neuro: Alert and oriented X 3. Moves all extremities spontaneously. Gait is normal. CNII-XII grossly in tact. Psych:  Responds to questions appropriately with a normal affect.   Results for orders placed or performed in visit on 01/28/16  STREP GROUP A AG, W/REFLEX TO CULT  Result Value Ref Range   SOURCE THROAT    STREGTOCOCCUS GROUP A AG SCREEN Detected (A)      ASSESSMENT AND PLAN:  [age] y.o. year old male with  1. Strep pharyngitis He is to start abx immediately,take as directed, complete all of it. F/U if symptoms do not resolve upon completion of abx.  - amoxicillin-clavulanate (AUGMENTIN) 875-125 MG tablet; Take 1 tablet by mouth 2 (two) times daily.  Dispense: 20 tablet; Refill: 0  2. Sorethroat - STREP GROUP A AG, W/REFLEX TO CULT   Signed, [id number]ajiyah [place] Beth [place], [place], [facility] 01/28/2016 10:06 AM

## 2016-05-18 ENCOUNTER — Ambulatory Visit (INDEPENDENT_AMBULATORY_CARE_PROVIDER_SITE_OTHER): Admitting: Family Medicine

## 2016-05-18 ENCOUNTER — Encounter: Payer: Self-pay | Admitting: Family Medicine

## 2016-05-18 VITALS — BP 118/64 | HR 68 | Temp 99.0°F | Resp 14 | Ht 69.0 in | Wt 195.0 lb

## 2016-05-18 DIAGNOSIS — G44209 Tension-type headache, unspecified, not intractable: Secondary | ICD-10-CM

## 2016-05-18 DIAGNOSIS — M791 Myalgia, unspecified site: Secondary | ICD-10-CM

## 2016-05-18 MED ORDER — BUTALBITAL-APAP-CAFFEINE 50-325-40 MG PO TABS: 1.0000 | ORAL_TABLET | Freq: Four times a day (QID) | ORAL | 0 refills | Status: DC | PRN
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
## 2016-05-19 LAB — CBC WITH DIFFERENTIAL/PLATELET
BASOS ABS: 71 {cells}/uL (ref 0–200)
BASOS PCT: 1 %
EOS PCT: 2 %
Eosinophils Absolute: 142 cells/uL (ref 15–500)
HCT: 42.7 % (ref 38.5–50.0)
HEMOGLOBIN: 14.3 g/dL (ref 13.0–17.0)
LYMPHS ABS: 2130 {cells}/uL (ref 850–3900)
Lymphocytes Relative: 30 %
MCH: 29.1 pg (ref 27.0–33.0)
MCHC: 33.5 g/dL (ref 32.0–36.0)
MCV: 87 fL (ref 80.0–100.0)
MPV: 11.3 fL (ref 7.5–12.5)
Monocytes Absolute: 497 cells/uL (ref 200–950)
Monocytes Relative: 7 %
NEUTROS ABS: 4260 {cells}/uL (ref 1500–7800)
Neutrophils Relative %: 60 %
PLATELETS: 199 10*3/uL (ref 140–400)
RBC: 4.91 MIL/uL (ref 4.20–5.80)
RDW: 13.5 % (ref 11.0–15.0)
WBC: 7.1 10*3/uL (ref 3.8–10.8)

## 2016-05-19 LAB — SEDIMENTATION RATE: Sed Rate: 1 mm/hr (ref 0–15)

## 2016-05-19 LAB — COMPLETE METABOLIC PANEL WITH GFR
ALBUMIN: 3.9 g/dL (ref 3.6–5.1)
ALK PHOS: 65 U/L (ref 40–115)
ALT: 22 U/L (ref 9–46)
AST: 18 U/L (ref 10–40)
BILIRUBIN TOTAL: 0.3 mg/dL (ref 0.2–1.2)
BUN: 19 mg/dL (ref 7–25)
CO2: 24 mmol/L (ref 20–31)
CREATININE: 1.08 mg/dL (ref 0.60–1.35)
Calcium: 9.2 mg/dL (ref 8.6–10.3)
Chloride: 105 mmol/L (ref 98–110)
GFR, EST NON AFRICAN AMERICAN: 83 mL/min (ref >=60)
GFR, Est African American: >89 mL/min (ref >=60)
GLUCOSE: 84 mg/dL (ref 70–99)
Potassium: 4.1 mmol/L (ref 3.5–5.3)
SODIUM: 142 mmol/L (ref 135–146)
TOTAL PROTEIN: 6.7 g/dL (ref 6.1–8.1)

## 2016-05-26 LAB — LYME ABY, WSTRN BLT IGG & IGM W/BANDS
B burgdorferi IgG Abs (IB): NEGATIVE
B burgdorferi IgM Abs (IB): NEGATIVE
LYME DISEASE 18 KD IGG: NONREACTIVE
LYME DISEASE 23 KD IGG: NONREACTIVE
LYME DISEASE 28 KD IGG: NONREACTIVE
LYME DISEASE 39 KD IGM: NONREACTIVE
LYME DISEASE 41 KD IGM: NONREACTIVE
LYME DISEASE 45 KD IGG: NONREACTIVE
Lyme Disease 23 kD IgM: NONREACTIVE
Lyme Disease 30 kD IgG: NONREACTIVE
Lyme Disease 39 kD IgG: NONREACTIVE
Lyme Disease 41 kD IgG: NONREACTIVE
Lyme Disease 58 kD IgG: NONREACTIVE
Lyme Disease 66 kD IgG: REACTIVE — AB
Lyme Disease 93 kD IgG: NONREACTIVE

## 2016-05-31 ENCOUNTER — Other Ambulatory Visit: Payer: Self-pay | Admitting: Family Medicine

## 2016-05-31 DIAGNOSIS — R51 Headache: Principal | ICD-10-CM

## 2016-05-31 DIAGNOSIS — G8929 Other chronic pain: Secondary | ICD-10-CM

## 2016-06-29 ENCOUNTER — Ambulatory Visit (HOSPITAL_COMMUNITY)
Admission: RE | Admit: 2016-06-29 | Discharge: 2016-06-29 | Disposition: A | Discharge status: Home / Self Care | Source: Ambulatory Visit | Attending: Family Medicine | Admitting: Family Medicine

## 2016-06-29 DIAGNOSIS — R519 Headache, unspecified: Secondary | ICD-10-CM

## 2016-06-29 DIAGNOSIS — R51 Headache: Secondary | ICD-10-CM | POA: Diagnosis not present

## 2016-06-29 MED ORDER — IOPAMIDOL (ISOVUE-300) INJECTION 61%
75.0000 mL | Freq: Once | INTRAVENOUS | Status: AC | PRN
  Administered 2016-06-29: 75 mL via INTRAVENOUS

## 2016-07-01 ENCOUNTER — Ambulatory Visit (INDEPENDENT_AMBULATORY_CARE_PROVIDER_SITE_OTHER): Admitting: Family Medicine

## 2016-07-01 ENCOUNTER — Encounter: Payer: Self-pay | Admitting: Family Medicine

## 2016-07-01 VITALS — BP 100/64 | HR 60 | Temp 98.6°F | Resp 14 | Ht 69.0 in | Wt 196.0 lb

## 2016-07-01 DIAGNOSIS — R51 Headache: Secondary | ICD-10-CM | POA: Diagnosis not present

## 2016-07-01 DIAGNOSIS — R519 Headache, unspecified: Secondary | ICD-10-CM

## 2016-07-01 MED ORDER — PANTOPRAZOLE SODIUM 40 MG PO TBEC: 40.0000 mg | DELAYED_RELEASE_TABLET | Freq: Every day | ORAL | 3 refills | Status: DC

## 2016-07-01 MED ORDER — TOPIRAMATE 25 MG PO TABS: 50.0000 mg | ORAL_TABLET | Freq: Two times a day (BID) | ORAL | 3 refills | Status: DC
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
## 2016-08-05 ENCOUNTER — Encounter: Payer: Self-pay | Admitting: *Deleted

## 2016-08-05 ENCOUNTER — Ambulatory Visit (INDEPENDENT_AMBULATORY_CARE_PROVIDER_SITE_OTHER): Admitting: *Deleted

## 2016-08-05 DIAGNOSIS — Z23 Encounter for immunization: Secondary | ICD-10-CM

## 2016-08-05 NOTE — Progress Notes (Signed)
Patient ID: Bryan Khan, male   DOB: 06-07-1971, 45 y.o.   MRN: [id number] Patient seen in office for Influenza Vaccination.   Tolerated IM administration well.   Immunization history updated.

## 2016-10-14 ENCOUNTER — Ambulatory Visit (INDEPENDENT_AMBULATORY_CARE_PROVIDER_SITE_OTHER): Admitting: Family Medicine

## 2016-10-14 ENCOUNTER — Encounter: Payer: Self-pay | Admitting: Family Medicine

## 2016-10-14 VITALS — BP 110/78 | HR 58 | Temp 98.9°F | Resp 18 | Ht 69.0 in | Wt 187.0 lb

## 2016-10-14 DIAGNOSIS — R05 Cough: Secondary | ICD-10-CM

## 2016-10-14 DIAGNOSIS — J029 Acute pharyngitis, unspecified: Secondary | ICD-10-CM

## 2016-10-14 DIAGNOSIS — J020 Streptococcal pharyngitis: Secondary | ICD-10-CM | POA: Diagnosis not present

## 2016-10-14 DIAGNOSIS — R059 Cough, unspecified: Secondary | ICD-10-CM

## 2016-10-14 LAB — STREP GROUP A AG, W/REFLEX TO CULT: STREGTOCOCCUS GROUP A AG SCREEN: DETECTED — AB

## 2016-10-14 MED ORDER — AMOXICILLIN 875 MG PO TABS: 875.0000 mg | ORAL_TABLET | Freq: Two times a day (BID) | ORAL | 0 refills | Status: DC

## 2016-10-14 MED ORDER — HYDROCODONE-HOMATROPINE 5-1.5 MG/5ML PO SYRP: 5.0000 mL | ORAL_SOLUTION | Freq: Three times a day (TID) | ORAL | 0 refills | Status: DC | PRN
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
## 2016-11-26 ENCOUNTER — Ambulatory Visit (INDEPENDENT_AMBULATORY_CARE_PROVIDER_SITE_OTHER): Admitting: Family Medicine

## 2016-11-26 ENCOUNTER — Encounter: Payer: Self-pay | Admitting: Family Medicine

## 2016-11-26 VITALS — BP 108/68 | HR 58 | Temp 98.2°F | Resp 18 | Ht 69.0 in | Wt 188.0 lb

## 2016-11-26 DIAGNOSIS — K219 Gastro-esophageal reflux disease without esophagitis: Secondary | ICD-10-CM

## 2016-11-26 DIAGNOSIS — R058 Other specified cough: Secondary | ICD-10-CM

## 2016-11-26 DIAGNOSIS — R05 Cough: Secondary | ICD-10-CM

## 2016-11-26 MED ORDER — PREDNISONE 20 MG PO TABS: ORAL_TABLET | ORAL | 0 refills | Status: DC

## 2016-11-26 MED ORDER — FLUTICASONE PROPIONATE 50 MCG/ACT NA SUSP: 2.0000 | Freq: Every day | NASAL | 6 refills | Status: DC
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
## 2016-12-31 ENCOUNTER — Telehealth: Payer: Self-pay | Admitting: Physician Assistant

## 2016-12-31 DIAGNOSIS — R0989 Other specified symptoms and signs involving the circulatory and respiratory systems: Secondary | ICD-10-CM

## 2016-12-31 NOTE — Telephone Encounter (Signed)
See last ov. Ok with ent referral.

## 2016-12-31 NOTE — Telephone Encounter (Signed)
Patient is calling to say that his throat is still bothering him, should he get referral for ent or see dr pickard again  Also would like a call back regarding his past lab work, and and hiv test

## 2017-01-03 NOTE — Telephone Encounter (Signed)
Pt aware will do referral and referral placed. Pt wanted to know if he had had a HIV test here in our office and he has not in the last 5 years.

## 2017-03-07 ENCOUNTER — Other Ambulatory Visit: Payer: Self-pay | Admitting: Family Medicine

## 2017-03-07 DIAGNOSIS — R51 Headache: Principal | ICD-10-CM

## 2017-03-07 DIAGNOSIS — R519 Headache, unspecified: Secondary | ICD-10-CM

## 2017-06-17 ENCOUNTER — Encounter: Payer: Self-pay | Admitting: Family Medicine

## 2017-06-17 ENCOUNTER — Ambulatory Visit (INDEPENDENT_AMBULATORY_CARE_PROVIDER_SITE_OTHER): Admitting: Family Medicine

## 2017-06-17 VITALS — BP 100/60 | HR 60 | Temp 98.6°F | Resp 18

## 2017-06-17 DIAGNOSIS — J029 Acute pharyngitis, unspecified: Secondary | ICD-10-CM

## 2017-06-17 NOTE — Progress Notes (Signed)
Noted nursing notes Strep neg pt not seen by provider     Pt came to office wanting to be checked for strep throat.  No appointments were available.  Told nurse having a little scratchy throat.  No other complaints of fever, malaise, cough.  Vital signs are normal.  Did run a rapid strep which was negative.  Told patient to treat with OTC throat lozenges, acetaminophen/ibuprofen, push fluids.  If worsens over the weekend can go to Urgent Care or call us back Monday.

## 2017-06-20 LAB — CULTURE, GROUP A STREP
MICRO NUMBER: 81078765
SPECIMEN QUALITY: ADEQUATE

## 2017-06-20 LAB — STREP GROUP A AG, W/REFLEX TO CULT: STREPTOCOCCUS, GROUP A SCREEN (DIRECT): NOT DETECTED

## 2017-08-04 ENCOUNTER — Ambulatory Visit

## 2017-08-04 ENCOUNTER — Ambulatory Visit (INDEPENDENT_AMBULATORY_CARE_PROVIDER_SITE_OTHER): Admitting: Family Medicine

## 2017-08-04 ENCOUNTER — Encounter: Payer: Self-pay | Admitting: Family Medicine

## 2017-08-04 VITALS — BP 110/68 | HR 54 | Temp 98.0°F | Resp 12 | Ht 69.0 in | Wt 200.0 lb

## 2017-08-04 DIAGNOSIS — K4090 Unilateral inguinal hernia, without obstruction or gangrene, not specified as recurrent: Secondary | ICD-10-CM | POA: Diagnosis not present

## 2017-08-04 DIAGNOSIS — S76219A Strain of adductor muscle, fascia and tendon of unspecified thigh, initial encounter: Secondary | ICD-10-CM

## 2017-08-04 DIAGNOSIS — Z23 Encounter for immunization: Secondary | ICD-10-CM
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
## 2018-01-31 ENCOUNTER — Other Ambulatory Visit: Payer: Self-pay | Admitting: Family Medicine

## 2018-01-31 MED ORDER — FLUTICASONE PROPIONATE 50 MCG/ACT NA SUSP: 2.0000 | Freq: Every day | NASAL | 6 refills | Status: DC

## 2018-04-06 ENCOUNTER — Other Ambulatory Visit: Payer: Self-pay

## 2018-04-06 ENCOUNTER — Ambulatory Visit (INDEPENDENT_AMBULATORY_CARE_PROVIDER_SITE_OTHER): Admitting: Family Medicine

## 2018-04-06 ENCOUNTER — Encounter: Payer: Self-pay | Admitting: Family Medicine

## 2018-04-06 VITALS — BP 118/58 | HR 60 | Temp 99.8°F | Resp 14 | Ht 69.0 in | Wt 208.0 lb

## 2018-04-06 DIAGNOSIS — J029 Acute pharyngitis, unspecified: Secondary | ICD-10-CM

## 2018-04-06 MED ORDER — AMOXICILLIN 500 MG PO CAPS: 500.0000 mg | ORAL_CAPSULE | Freq: Two times a day (BID) | ORAL | 0 refills | Status: AC

## 2018-04-06 MED ORDER — OMEPRAZOLE 40 MG PO CPDR: 40.0000 mg | DELAYED_RELEASE_CAPSULE | Freq: Every day | ORAL | 1 refills | Status: DC

## 2018-04-06 MED ORDER — PREDNISONE 20 MG PO TABS: ORAL_TABLET | ORAL | 0 refills | Status: DC

## 2018-04-06 NOTE — Patient Instructions (Signed)
Rapid strep test was negative and will take a few days for the throat culture to result, I will call you with results.  I will call you with positive results of the week and see you can fill and start the antibiotic.  If it is negative and be you begin to improve you can hold the antibiotic and nursing staff will likely call you with negative results early next week on Monday or Tuesday.  You can try a steroid taper which will decrease inflammation swelling and pain.  Use Tylenol and ibuprofen and drink plenty of fluids.  These follow-up with us if you start the antibiotics please notify us when you started them, if you do not improve please follow-up with us in 1 to 2 weeks   Pharyngitis Pharyngitis is a sore throat (pharynx). There is redness, pain, and swelling of your throat. Follow these instructions at home:  Drink enough fluids to keep your pee (urine) clear or pale yellow.  Only take medicine as told by your doctor. ? You may get sick again if you do not take medicine as told. Finish your medicines, even if you start to feel better. ? Do not take aspirin.  Rest.  Rinse your mouth (gargle) with salt water ( tsp of salt per 1 qt of water) every 1-2 hours. This will help the pain.  If you are not at risk for choking, you can suck on hard candy or sore throat lozenges. Contact a doctor if:  You have large, tender lumps on your neck.  You have a rash.  You cough up green, yellow-brown, or bloody spit. Get help right away if:  You have a stiff neck.  You drool or cannot swallow liquids.  You throw up (vomit) or are not able to keep medicine or liquids down.  You have very bad pain that does not go away with medicine.  You have problems breathing (not from a stuffy nose). This information is not intended to replace advice given to you by your health care provider. Make sure you discuss any questions you have with your health care provider. Document Released: 02/23/2008  Document Revised: 02/12/2016 Document Reviewed: 05/14/2013 Elsevier Interactive Patient Education  2017 [employer]

## 2018-04-06 NOTE — Progress Notes (Signed)
Patient ID: Bryan Cromerhomas J Hakim, male    DOB: 08/12/1971, 47 y.o.   MRN: [id number]  PCP: Dorena Bodoixon, Mary B, PA-C  Chief Complaint  Patient presents with  . Illness    x3 days- sore throat, low grade fever, body aches, swollen glands in neck    Subjective:   Bryan Khan is a 47 y.o. male, presents to clinic with CC of 3 days of severe sore throat with associated pain radiating to ears, body aches and low-grade fever, T-max 99.8.  Does have associated swollen and tender lymph nodes from his ears all along under his jawline some extension down into the throat.  Denies any trismus, stridor, coughing or choking.  States that his throat is very swollen is having "difficulty swallowing" however he has been able to swallow pills and solid foods.  He states that he has a history of strep throat at least once a year, has been to ENT in the past, states that he has had some deviation of his trachea from a past complication.  He denies any past peritonsillar abscesses.  He also has mild associated headache and generalized malaise but he denies any nasal symptoms, congestion, postnasal drip.  He states his sore throat did not start gradually with a scratchy throat but it came on suddenly and severe and has been constant for 3 days it without any other alleviating factors.  History of seasonal allergies but states these are well controlled.  Denies cough, rash, sick contacts.   Patient Active Problem List   Diagnosis Date Noted  . Rectal bleeding 12/25/2015  . Hematemesis 08/23/2013  . GERD (gastroesophageal reflux disease) 07/26/2013  . Allergic rhinitis 03/15/2011  . Genital warts 03/15/2011  . Back pain 03/15/2011     Prior to Admission medications   Medication Sig Start Date End Date Taking? Authorizing Provider  fluticasone (FLONASE) 50 MCG/ACT nasal spray Place 2 sprays into both nostrils daily. 01/31/18  Yes Donita BrooksPickard, Warren T, MD  omeprazole (PRILOSEC) 40 MG capsule Take 40 mg by mouth daily.    Yes [provider]     Allergies  Allergen Reactions  . Codeine Nausea Only     Family History  Problem Relation Age of Onset  . Hyperlipidemia Father   . Anxiety disorder Father   . Aneurysm Cousin   . Hypertension Paternal Uncle   . Diabetes Maternal Grandfather   . Heart disease Maternal Grandfather   . Hypertension Paternal Grandmother   . Cancer Paternal Grandfather        rare blood disease  . Colon cancer Neg Hx      Social History   Socioeconomic History  . Marital status: Married    Spouse name: Not on file  . Number of children: 2  . Years of education: Not on file  . Highest education level: Not on file  Occupational History  . Occupation: [employer] FT    Employer: [employer]  Social Needs  . Financial resource strain: Not on file  . Food insecurity:    Worry: Not on file    Inability: Not on file  . Transportation needs:    Medical: Not on file    Non-medical: Not on file  Tobacco Use  . Smoking status: Former Smoker    Last attempt to quit: 02/01/1994    Years since quitting: 24.1  . Smokeless tobacco: Never Used  . Tobacco comment: Quit x 20 years  Substance and Sexual Activity  .  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  . Drug use: No  . Sexual activity: Not on file  Lifestyle  . Physical activity:    Days per week: Not on file    Minutes per session: Not on file  . Stress: Not on file  Relationships  . Social connections:    Talks on phone: Not on file    Gets together: Not on file    Attends religious service: Not on file    Active member of club or organization: Not on file    Attends meetings of clubs or organizations: Not on file    Relationship status: Not on file  . Intimate partner violence:    Fear of current or ex  partner: Not on file    Emotionally abused: Not on file    Physically abused: Not on file    Forced sexual activity: Not on file  Other Topics Concern  . Not on file  Social History Narrative  . Not on file     Review of Systems  Constitutional: Negative.  Negative for activity change, appetite change, chills and diaphoresis.  HENT: Negative for congestion, ear discharge, facial swelling, postnasal drip, rhinorrhea, sinus pressure, sinus pain, sneezing and voice change.   Eyes: Negative.   Respiratory: Negative.   Cardiovascular: Negative.   Gastrointestinal: Negative.   Genitourinary: Negative.   Musculoskeletal: Negative.   Skin: Negative.   Neurological: Negative.   Hematological: Positive for adenopathy. Does not bruise/bleed easily.  All other systems reviewed and are negative.      Objective:    Vitals:   04/06/18 0915  BP: (!) 118/58  Pulse: 60  Resp: 14  Temp: 99.8 F (37.7 C)  TempSrc: Oral  SpO2: 98%  Weight: 208 lb (94.3 kg)  Height: 5\' 9"  (1.753 m)      Physical Exam  Constitutional: He appears well-developed.  HENT:  Head: Normocephalic and atraumatic.  Right Ear: Tympanic membrane, external ear and ear canal normal.  Left Ear: Tympanic membrane, external ear and ear canal normal.  Nose: Mucosal edema present. Right sinus exhibits no maxillary sinus tenderness and no frontal sinus tenderness. Left sinus exhibits no maxillary sinus tenderness and no frontal sinus tenderness.  Mouth/Throat: Uvula is midline and mucous membranes are normal. No trismus in the jaw. No uvula swelling. Posterior oropharyngeal edema and posterior oropharyngeal erythema present. No oropharyngeal exudate or tonsillar abscesses. No tonsillar exudate.  Nasal mucosa bilaterally edematous and mildly erythematous with scant clear discharge Posterior oropharynx diffusely erythematous, tonsillar pillars appear symmetrically edematous  Eyes: Pupils are equal, round, and reactive to  light. Conjunctivae are normal. Right eye exhibits no discharge. Left eye exhibits no discharge. No scleral icterus.  Neck: Normal range of motion. Neck supple. No thyromegaly present.  Adams apple mildly deviated to the right, normal for pt for past 2 years  Cardiovascular: Normal rate, regular rhythm, normal heart sounds and intact distal pulses. Exam reveals no gallop and no friction rub.  No murmur heard. Pulmonary/Chest: Effort normal and breath sounds normal. No stridor. No respiratory distress. He has no wheezes. He has no rales. He exhibits no tenderness.  Abdominal: Soft. Bowel sounds are normal. He exhibits no distension.  Musculoskeletal: Normal range of motion.  Lymphadenopathy:  Head (right side): Submental, submandibular and tonsillar adenopathy present.       Head (left side): Submental, submandibular and tonsillar adenopathy present.    He has no cervical adenopathy.  Neurological: He is alert. He exhibits normal muscle tone. Coordination normal.  Skin: Skin is warm and dry. No rash noted.  Psychiatric: He has a normal mood and affect. His behavior is normal.  Nursing note and vitals reviewed.    Results for orders placed or performed in visit on 04/06/18  STREP GROUP A AG, W/REFLEX TO CULT  Result Value Ref Range   Streptococcus, Group A Screen (Direct) NONE DETECTED         Assessment & Plan:      ICD-10-CM   1. Sore throat J02.9 STREP GROUP A AG, W/REFLEX TO CULT    CANCELED: STREP GROUP A AG, W/REFLEX TO CULT     47 year old male presents with 3 days of pharyngitis with radiation of pain to ears, swollen lymph nodes, low-grade fever, body aches, and he denies any nasal symptoms or cough.  Rapid strep is negative.  He does complain of recurrent history of strep throat and complications in the recent past.  Discussed that with mild nasal erythema on exam and posterior oropharynx erythema on exam it may be a viral infection that will run its course.  I did  print for him amoxicillin prescription on paper to hold for going into the weekend that if his throat culture comes back positive he is able to fill that and start treatment for strep throat.   Prescribed short steroid burst to see if it will help with his symptoms.  Patient urged to continue to drink plenty of fluids, use analgesics and antipyretics as needed and as directed over-the-counter.  Danelle Berry, PA-C 04/06/18 9:17 AM

## 2018-04-08 LAB — CULTURE, GROUP A STREP
MICRO NUMBER:: 90851625
SPECIMEN QUALITY:: ADEQUATE

## 2018-04-08 LAB — STREP GROUP A AG, W/REFLEX TO CULT: STREPTOCOCCUS, GROUP A SCREEN (DIRECT): NOT DETECTED

## 2018-04-10 NOTE — Progress Notes (Signed)
Negative throat culture - pt will be notified

## 2018-04-10 NOTE — Progress Notes (Signed)
I told him it looked like a viral illness.  No other testing would change what we are currently doing.  Just keep taking tylenol/ibuprofen, resting, drinking fluids.  It will likely run its course.  He could come into office if not better in 1 more week.  Viral illnesses can last 2-3 days or sometimes 2-3 weeks.

## 2018-05-19 ENCOUNTER — Ambulatory Visit (HOSPITAL_COMMUNITY)
Admission: RE | Admit: 2018-05-19 | Discharge: 2018-05-19 | Disposition: A | Discharge status: Home / Self Care | Source: Ambulatory Visit | Attending: Family Medicine | Admitting: Family Medicine

## 2018-05-19 ENCOUNTER — Encounter: Payer: Self-pay | Admitting: Family Medicine

## 2018-05-19 ENCOUNTER — Ambulatory Visit (INDEPENDENT_AMBULATORY_CARE_PROVIDER_SITE_OTHER): Admitting: Family Medicine

## 2018-05-19 VITALS — BP 108/70 | HR 56 | Temp 97.7°F | Resp 12 | Ht 69.0 in | Wt 204.0 lb

## 2018-05-19 DIAGNOSIS — M5442 Lumbago with sciatica, left side: Secondary | ICD-10-CM | POA: Diagnosis not present

## 2018-05-19 DIAGNOSIS — M25561 Pain in right knee: Secondary | ICD-10-CM | POA: Diagnosis not present

## 2018-05-19 DIAGNOSIS — M5441 Lumbago with sciatica, right side: Secondary | ICD-10-CM | POA: Diagnosis not present

## 2018-05-19 DIAGNOSIS — Z0000 Encounter for general adult medical examination without abnormal findings: Secondary | ICD-10-CM | POA: Diagnosis not present
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
## 2018-05-20 LAB — CBC WITH DIFFERENTIAL/PLATELET
BASOS ABS: 77 {cells}/uL (ref 0–200)
Basophils Relative: 1.2 %
EOS ABS: 109 {cells}/uL (ref 15–500)
EOS PCT: 1.7 %
HEMATOCRIT: 44.2 % (ref 38.5–50.0)
HEMOGLOBIN: 15.2 g/dL (ref 13.2–17.1)
LYMPHS ABS: 2048 {cells}/uL (ref 850–3900)
MCH: 30.8 pg (ref 27.0–33.0)
MCHC: 34.4 g/dL (ref 32.0–36.0)
MCV: 89.5 fL (ref 80.0–100.0)
MPV: 12.3 fL (ref 7.5–12.5)
Monocytes Relative: 9.2 %
NEUTROS PCT: 55.9 %
Neutro Abs: 3578 cells/uL (ref 1500–7800)
Platelets: 191 10*3/uL (ref 140–400)
RBC: 4.94 10*6/uL (ref 4.20–5.80)
RDW: 12.2 % (ref 11.0–15.0)
Total Lymphocyte: 32 %
WBC mixed population: 589 cells/uL (ref 200–950)
WBC: 6.4 10*3/uL (ref 3.8–10.8)

## 2018-05-20 LAB — COMPLETE METABOLIC PANEL WITH GFR
AG Ratio: 1.9 (calc) (ref 1.0–2.5)
ALKALINE PHOSPHATASE (APISO): 60 U/L (ref 40–115)
ALT: 29 U/L (ref 9–46)
AST: 19 U/L (ref 10–40)
Albumin: 4.5 g/dL (ref 3.6–5.1)
BUN: 18 mg/dL (ref 7–25)
CALCIUM: 9.7 mg/dL (ref 8.6–10.3)
CO2: 26 mmol/L (ref 20–32)
CREATININE: 1.23 mg/dL (ref 0.60–1.35)
Chloride: 107 mmol/L (ref 98–110)
GFR, EST NON AFRICAN AMERICAN: 70 mL/min/{1.73_m2} (ref >=60)
GFR, Est African American: 81 mL/min/{1.73_m2} (ref >=60)
GLOBULIN: 2.4 g/dL (ref 1.9–3.7)
GLUCOSE: 98 mg/dL (ref 65–99)
Potassium: 4.6 mmol/L (ref 3.5–5.3)
SODIUM: 140 mmol/L (ref 135–146)
Total Bilirubin: 0.5 mg/dL (ref 0.2–1.2)
Total Protein: 6.9 g/dL (ref 6.1–8.1)

## 2018-05-23 ENCOUNTER — Other Ambulatory Visit: Payer: Self-pay | Admitting: *Deleted

## 2018-05-23 MED ORDER — PREDNISONE 20 MG PO TABS: ORAL_TABLET | ORAL | 0 refills | Status: DC

## 2018-05-25 ENCOUNTER — Telehealth: Payer: Self-pay | Admitting: Family Medicine

## 2018-05-25 NOTE — Telephone Encounter (Signed)
cpe form dropped off to be filled out for work, placed into yellow folder.

## 2018-05-29 NOTE — Telephone Encounter (Signed)
Form filled out and faxed back to [facility] at [phone]

## 2018-06-07 ENCOUNTER — Other Ambulatory Visit: Payer: Self-pay | Admitting: Family Medicine

## 2018-06-07 MED ORDER — OMEPRAZOLE 40 MG PO CPDR: 40.0000 mg | DELAYED_RELEASE_CAPSULE | Freq: Every day | ORAL | 3 refills | Status: DC

## 2018-07-13 DIAGNOSIS — M2391 Unspecified internal derangement of right knee: Secondary | ICD-10-CM | POA: Insufficient documentation

## 2018-08-21 ENCOUNTER — Ambulatory Visit (INDEPENDENT_AMBULATORY_CARE_PROVIDER_SITE_OTHER): Admitting: Family Medicine

## 2018-08-21 DIAGNOSIS — Z23 Encounter for immunization: Secondary | ICD-10-CM

## 2018-11-27 ENCOUNTER — Other Ambulatory Visit: Payer: Self-pay | Admitting: Family Medicine

## 2018-11-27 ENCOUNTER — Telehealth: Payer: Self-pay | Admitting: Family Medicine

## 2018-11-27 MED ORDER — FLUTICASONE PROPIONATE 50 MCG/ACT NA SUSP: 2.0000 | Freq: Every day | NASAL | 6 refills | Status: DC

## 2018-11-27 NOTE — Telephone Encounter (Signed)
Patient requesting refill on his flonase  [facility]

## 2018-11-27 NOTE — Telephone Encounter (Signed)
Medication called/sent to requested pharmacy  

## 2019-04-17 ENCOUNTER — Other Ambulatory Visit

## 2019-04-17 ENCOUNTER — Other Ambulatory Visit: Payer: Self-pay

## 2019-04-17 ENCOUNTER — Telehealth: Payer: Self-pay | Admitting: Family Medicine

## 2019-04-17 ENCOUNTER — Other Ambulatory Visit: Payer: Self-pay | Admitting: Family Medicine

## 2019-04-17 DIAGNOSIS — Z20828 Contact with and (suspected) exposure to other viral communicable diseases: Secondary | ICD-10-CM

## 2019-04-17 DIAGNOSIS — Z20822 Contact with and (suspected) exposure to covid-19: Secondary | ICD-10-CM

## 2019-04-17 NOTE — Telephone Encounter (Signed)
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   Please advise.

## 2019-04-17 NOTE — Telephone Encounter (Signed)
Called left message on vm to go to [facility] building to have and test and order placed.

## 2019-04-19 LAB — NOVEL CORONAVIRUS, NAA: SARS-CoV-2, NAA: NOT DETECTED

## 2019-04-20 NOTE — Telephone Encounter (Signed)
COVID test negative and pt aware

## 2019-06-01 ENCOUNTER — Other Ambulatory Visit: Payer: Self-pay | Admitting: Family Medicine

## 2019-06-07 ENCOUNTER — Encounter: Payer: Self-pay | Admitting: Family Medicine

## 2019-06-07 ENCOUNTER — Ambulatory Visit (INDEPENDENT_AMBULATORY_CARE_PROVIDER_SITE_OTHER): Admitting: Family Medicine

## 2019-06-07 ENCOUNTER — Other Ambulatory Visit: Payer: Self-pay

## 2019-06-07 VITALS — BP 126/82 | HR 48 | Temp 98.3°F | Resp 12 | Ht 69.0 in | Wt 204.0 lb

## 2019-06-07 DIAGNOSIS — Z23 Encounter for immunization: Secondary | ICD-10-CM | POA: Diagnosis not present

## 2019-06-07 DIAGNOSIS — Z0000 Encounter for general adult medical examination without abnormal findings: Secondary | ICD-10-CM

## 2019-06-07 MED ORDER — TIZANIDINE HCL 4 MG PO TABS: 4.0000 mg | ORAL_TABLET | Freq: Four times a day (QID) | ORAL | 0 refills | Status: DC | PRN

## 2019-06-07 NOTE — Progress Notes (Signed)
Subjective:    Patient ID: Bryan Khan, male    DOB: 01-27-1971, 48 y.o.   MRN: [id number]  HPI  Patient is here today for complete physical exam.  He recently injured his lower back.  He was stretching when he felt his back develop a tearing pain around the level of L4-L5.  The pain stays in the center of his lower back.  It does not radiate down his legs or into his feet.  He denies any lumbar radiculopathy.  He reports a cramp-like spasm that is constant in his lower back with decreased range of motion.  It feels better the more he is up moving.  It feels worse after prolonged periods of immobilization.  He denies any saddle anesthesia or bowel or bladder incontinence or hematuria or dysuria.  Otherwise he is doing well.  He is due for a flu shot.  He is also due for regular fasting lab work.  He also requests an HIV test. Past Medical History:  Diagnosis Date  . Allergy   . GERD (gastroesophageal reflux disease)   . Reflux esophagitis   . Warts, genital 11/2006   Current Outpatient Medications on File Prior to Visit  Medication Sig Dispense Refill  . omeprazole (PRILOSEC) 40 MG capsule TAKE 1 CAPSULE(40 MG) BY MOUTH DAILY 90 capsule 3   No current facility-administered medications on file prior to visit.    Allergies  Allergen Reactions  . Codeine Nausea Only   Social History   Socioeconomic History  . Marital status: Married    Spouse name: Not on file  . Number of children: 2  . Years of education: Not on file  . Highest education level: Not on file  Occupational History  . Occupation: Oceanographer: [employer]  . Financial resource strain: Not on file  . Food insecurity    Worry: Not on file    Inability: Not on file  . Transportation needs    Medical: Not on file    Non-medical: Not on file  Tobacco Use  . Smoking status: Former Smoker    Quit date: 02/01/1994    Years since quitting: 25.3  . Smokeless tobacco: Never  Used  . Tobacco comment: Quit x 20 years  Substance and Sexual Activity  . 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  . Drug use: No  . Sexual activity: Not on file  Lifestyle  . Physical activity    Days per week: Not on file    Minutes per session: Not on file  . Stress: Not on file  Relationships  . Social Herbalist on phone: Not on file    Gets together: Not on file    Attends religious service: Not on file    Active member of club or organization: Not on file    Attends meetings of clubs or organizations: Not on file    Relationship status: Not on file  . Intimate partner violence    Fear of current or ex partner: Not on file    Emotionally abused: Not on file    Physically abused: Not on file    Forced sexual activity: Not on file  Other Topics  Concern  . Not on file  Social History Narrative  . Not on file      Review of Systems  Musculoskeletal: Positive for back pain.  All other systems reviewed and are negative.      Objective:   Physical Exam  Constitutional: He is oriented to person, place, and time. He appears well-developed and well-nourished. No distress.  HENT:  Head: Normocephalic and atraumatic.  Right Ear: External ear normal.  Left Ear: External ear normal.  Nose: Nose normal.  Mouth/Throat: Oropharynx is clear and moist. No oropharyngeal exudate.  Eyes: Pupils are equal, round, and reactive to light. Conjunctivae and EOM are normal. Right eye exhibits no discharge. Left eye exhibits no discharge. No scleral icterus.  Neck: Normal range of motion. Neck supple. No JVD present. No tracheal deviation present. No thyromegaly present.  Cardiovascular: Normal rate, regular rhythm, normal heart sounds and intact distal pulses. Exam reveals no gallop  and no friction rub.  No murmur heard. Pulmonary/Chest: Effort normal and breath sounds normal. No stridor. No respiratory distress. He has no wheezes. He has no rales. He exhibits no tenderness.  Abdominal: Soft. Bowel sounds are normal. He exhibits no distension and no mass. There is no abdominal tenderness. There is no rebound and no guarding. Hernia confirmed negative in the right inguinal area and confirmed negative in the left inguinal area.  Genitourinary:    Testes and penis normal.  Right testis shows no mass and no tenderness. Left testis shows no mass and no tenderness. Circumcised. No penile erythema. No discharge found.  Musculoskeletal:        General: No edema.     Lumbar back: He exhibits decreased range of motion, tenderness, bony tenderness and pain.  Lymphadenopathy:    He has no cervical adenopathy.  Neurological: He is alert and oriented to person, place, and time. He has normal reflexes. No cranial nerve deficit. He exhibits normal muscle tone. Coordination normal.  Skin: No rash noted. He is not diaphoretic.  Psychiatric: He has a normal mood and affect. His behavior is normal. Judgment and thought content normal.  Vitals reviewed.       Assessment & Plan:  Needs flu shot - Plan: Flu Vaccine QUAD 36+ mos IM  General medical exam - Plan: CBC with Differential/Platelet, COMPLETE METABOLIC PANEL WITH GFR, Lipid panel, HIV Antibody (routine testing w rflx)  Patient's physical exam is normal today.  His blood pressure is normal at 126/82.  He received his flu shot today.  He is not yet due for prostate cancer screening or colon cancer screening.  I believe the patient suffered a muscle strain in his lower back.  He can use Zanaflex 4 mg every 6 hours as needed for muscle pain.  Recommended tincture of time and this should gradually improve over the next 2 weeks.  Regular anticipatory guidance is provided.  Check CBC, CMP, and fasting lipid panel and per the patient's request  I will also check an HIV test.

## 2019-06-08 LAB — COMPLETE METABOLIC PANEL WITH GFR
AG Ratio: 1.7 (calc) (ref 1.0–2.5)
ALT: 25 U/L (ref 9–46)
AST: 19 U/L (ref 10–40)
Albumin: 4.3 g/dL (ref 3.6–5.1)
Alkaline phosphatase (APISO): 58 U/L (ref 36–130)
BUN: 14 mg/dL (ref 7–25)
CO2: 25 mmol/L (ref 20–32)
Calcium: 9.9 mg/dL (ref 8.6–10.3)
Chloride: 104 mmol/L (ref 98–110)
Creat: 1.13 mg/dL (ref 0.60–1.35)
GFR, Est African American: 89 mL/min/{1.73_m2} (ref >=60)
GFR, Est Non African American: 77 mL/min/{1.73_m2} (ref >=60)
Globulin: 2.5 g/dL (calc) (ref 1.9–3.7)
Glucose, Bld: 102 mg/dL — ABNORMAL HIGH (ref 65–99)
Potassium: 4.2 mmol/L (ref 3.5–5.3)
Sodium: 138 mmol/L (ref 135–146)
Total Bilirubin: 0.5 mg/dL (ref 0.2–1.2)
Total Protein: 6.8 g/dL (ref 6.1–8.1)

## 2019-06-08 LAB — CBC WITH DIFFERENTIAL/PLATELET
Absolute Monocytes: 591 cells/uL (ref 200–950)
Basophils Absolute: 88 cells/uL (ref 0–200)
Basophils Relative: 1.2 %
Eosinophils Absolute: 131 cells/uL (ref 15–500)
Eosinophils Relative: 1.8 %
HCT: 44.5 % (ref 38.5–50.0)
Hemoglobin: 15.3 g/dL (ref 13.2–17.1)
Lymphs Abs: 3037 cells/uL (ref 850–3900)
MCH: 30.5 pg (ref 27.0–33.0)
MCHC: 34.4 g/dL (ref 32.0–36.0)
MCV: 88.8 fL (ref 80.0–100.0)
MPV: 12.5 fL (ref 7.5–12.5)
Monocytes Relative: 8.1 %
Neutro Abs: 3453 cells/uL (ref 1500–7800)
Neutrophils Relative %: 47.3 %
Platelets: 195 10*3/uL (ref 140–400)
RBC: 5.01 10*6/uL (ref 4.20–5.80)
RDW: 12.6 % (ref 11.0–15.0)
Total Lymphocyte: 41.6 %
WBC: 7.3 10*3/uL (ref 3.8–10.8)

## 2019-06-08 LAB — LIPID PANEL
Cholesterol: 193 mg/dL (ref <200)
HDL: 64 mg/dL (ref >=40)
LDL Cholesterol (Calc): 113 mg/dL (calc) — ABNORMAL HIGH
Non-HDL Cholesterol (Calc): 129 mg/dL (calc) (ref <130)
Total CHOL/HDL Ratio: 3 (calc) (ref <5.0)
Triglycerides: 73 mg/dL (ref <150)

## 2019-06-08 LAB — HIV ANTIBODY (ROUTINE TESTING W REFLEX): HIV 1&2 Ab, 4th Generation: NONREACTIVE

## 2019-12-06 ENCOUNTER — Encounter (HOSPITAL_COMMUNITY): Payer: Self-pay

## 2019-12-06 ENCOUNTER — Other Ambulatory Visit: Payer: Self-pay

## 2019-12-06 ENCOUNTER — Ambulatory Visit (HOSPITAL_COMMUNITY)
Admission: RE | Admit: 2019-12-06 | Discharge: 2019-12-06 | Disposition: A | Discharge status: Home / Self Care | Source: Ambulatory Visit | Attending: Nurse Practitioner | Admitting: Nurse Practitioner

## 2019-12-06 ENCOUNTER — Ambulatory Visit (INDEPENDENT_AMBULATORY_CARE_PROVIDER_SITE_OTHER): Admitting: Nurse Practitioner

## 2019-12-06 VITALS — BP 120/72 | HR 54 | Temp 98.6°F | Resp 19 | Ht 69.0 in | Wt 202.6 lb

## 2019-12-06 DIAGNOSIS — R29898 Other symptoms and signs involving the musculoskeletal system: Secondary | ICD-10-CM

## 2019-12-06 DIAGNOSIS — M25512 Pain in left shoulder: Secondary | ICD-10-CM

## 2019-12-06 DIAGNOSIS — G8929 Other chronic pain: Secondary | ICD-10-CM

## 2019-12-06 DIAGNOSIS — M545 Low back pain, unspecified: Secondary | ICD-10-CM

## 2019-12-06 LAB — URINALYSIS, ROUTINE W REFLEX MICROSCOPIC
Bilirubin Urine: NEGATIVE
Glucose, UA: NEGATIVE
Hgb urine dipstick: NEGATIVE
Ketones, ur: NEGATIVE
Leukocytes,Ua: NEGATIVE
Nitrite: NEGATIVE
Protein, ur: NEGATIVE
Specific Gravity, Urine: 1.017 (ref 1.001–1.03)
pH: 5.5 (ref 5.0–8.0)

## 2019-12-06 MED ORDER — NAPROXEN 500 MG PO TABS: 500.0000 mg | ORAL_TABLET | Freq: Two times a day (BID) | ORAL | 0 refills | Status: DC

## 2019-12-06 MED ORDER — KETOROLAC TROMETHAMINE 60 MG/2ML IM SOLN
60.0000 mg | Freq: Once | INTRAMUSCULAR | Status: AC
  Administered 2019-12-06: 60 mg via INTRAMUSCULAR
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
## 2019-12-07 LAB — COMPLETE METABOLIC PANEL WITH GFR
AG Ratio: 1.7 (calc) (ref 1.0–2.5)
ALT: 35 U/L (ref 9–46)
AST: 25 U/L (ref 10–40)
Albumin: 4.3 g/dL (ref 3.6–5.1)
Alkaline phosphatase (APISO): 57 U/L (ref 36–130)
BUN: 17 mg/dL (ref 7–25)
CO2: 25 mmol/L (ref 20–32)
Calcium: 9.9 mg/dL (ref 8.6–10.3)
Chloride: 108 mmol/L (ref 98–110)
Creat: 1.16 mg/dL (ref 0.60–1.35)
GFR, Est African American: 86 mL/min/{1.73_m2} (ref >=60)
GFR, Est Non African American: 75 mL/min/{1.73_m2} (ref >=60)
Globulin: 2.6 g/dL (calc) (ref 1.9–3.7)
Glucose, Bld: 94 mg/dL (ref 65–99)
Potassium: 4.7 mmol/L (ref 3.5–5.3)
Sodium: 142 mmol/L (ref 135–146)
Total Bilirubin: 0.5 mg/dL (ref 0.2–1.2)
Total Protein: 6.9 g/dL (ref 6.1–8.1)

## 2019-12-07 LAB — CBC WITH DIFFERENTIAL/PLATELET
Absolute Monocytes: 637 cells/uL (ref 200–950)
Basophils Absolute: 70 cells/uL (ref 0–200)
Basophils Relative: 1 %
Eosinophils Absolute: 238 cells/uL (ref 15–500)
Eosinophils Relative: 3.4 %
HCT: 42.7 % (ref 38.5–50.0)
Hemoglobin: 14.5 g/dL (ref 13.2–17.1)
Lymphs Abs: 2877 cells/uL (ref 850–3900)
MCH: 30.8 pg (ref 27.0–33.0)
MCHC: 34 g/dL (ref 32.0–36.0)
MCV: 90.7 fL (ref 80.0–100.0)
MPV: 12 fL (ref 7.5–12.5)
Monocytes Relative: 9.1 %
Neutro Abs: 3178 cells/uL (ref 1500–7800)
Neutrophils Relative %: 45.4 %
Platelets: 192 10*3/uL (ref 140–400)
RBC: 4.71 10*6/uL (ref 4.20–5.80)
RDW: 12 % (ref 11.0–15.0)
Total Lymphocyte: 41.1 %
WBC: 7 10*3/uL (ref 3.8–10.8)

## 2019-12-10 NOTE — Progress Notes (Signed)
Let pt know to follow treatment plan per office visit and xray results   FINDINGS: There is no evidence of fracture or dislocation. There is no evidence of arthropathy or other focal bone abnormality. Soft tissues are unremarkable.  IMPRESSION: Negative.

## 2019-12-10 NOTE — Progress Notes (Signed)
Lab results normal pending lipid panel

## 2020-05-08 ENCOUNTER — Other Ambulatory Visit: Payer: Self-pay

## 2020-05-31 ENCOUNTER — Other Ambulatory Visit: Payer: Self-pay | Admitting: Family Medicine

## 2020-07-29 ENCOUNTER — Other Ambulatory Visit: Payer: Self-pay

## 2020-07-29 ENCOUNTER — Ambulatory Visit (INDEPENDENT_AMBULATORY_CARE_PROVIDER_SITE_OTHER): Admitting: Family Medicine

## 2020-07-29 VITALS — BP 124/88 | HR 61 | Temp 97.9°F | Ht 69.0 in | Wt 199.0 lb

## 2020-07-29 DIAGNOSIS — R059 Cough, unspecified: Secondary | ICD-10-CM

## 2020-07-29 DIAGNOSIS — Z20822 Contact with and (suspected) exposure to covid-19: Secondary | ICD-10-CM

## 2020-07-29 NOTE — Progress Notes (Signed)
Subjective:    Patient ID: Bryan Khan, male    DOB: April 19, 1971, 49 y.o.   MRN: [id number]  05/2019 Patient is here today for complete physical exam.  He recently injured his lower back.  He was stretching when he felt his back develop a tearing pain around the level of L4-L5.  The pain stays in the center of his lower back.  It does not radiate down his legs or into his feet.  He denies any lumbar radiculopathy.  He reports a cramp-like spasm that is constant in his lower back with decreased range of motion.  It feels better the more he is up moving.  It feels worse after prolonged periods of immobilization.  He denies any saddle anesthesia or bowel or bladder incontinence or hematuria or dysuria.  Otherwise he is doing well.  He is due for a flu shot.  He is also due for regular fasting lab work.  He also requests an HIV test.  At that time, my plan was: Patient's physical exam is normal today.  His blood pressure is normal at 126/82.  He received his flu shot today.  He is not yet due for prostate cancer screening or colon cancer screening.  I believe the patient suffered a muscle strain in his lower back.  He can use Zanaflex 4 mg every 6 hours as needed for muscle pain.  Recommended tincture of time and this should gradually improve over the next 2 weeks.  Regular anticipatory guidance is provided.  Check CBC, CMP, and fasting lipid panel and per the patient's request I will also check an HIV test.  07/29/20 Patient believes that he had COVID-19 back in August.  He states that he lost his sense of smell and taste.  He had symptoms consistent with an upper respiratory infection.  He was sick for approximately a week and then the symptoms subsided.  He is requesting lab work to see if he has had COVID to see if he has natural immunity because his job is requiring him to have the Covid vaccine.  I explained to the patient that even if he had Covid in August they recommend a booster shot 90 days after  the original infection which would still be available now to him however he would like to have lab work to see if he has had Covid as he definitely had close exposure in August.  Several of his family members tested positive.  He also reports a cough ever since August.  Occasionally is productive of clear and yellow sputum.  However mainly is just an irritant.  Today on his exam, his lungs are completely clear there is no wheezes crackles or rales so I feel is most likely an irritant cough either due to postnasal drainage or more likely due to his reflux. Past Medical History:  Diagnosis Date  . Allergy   . GERD (gastroesophageal reflux disease)   . Reflux esophagitis   . Warts, genital 11/2006   Current Outpatient Medications on File Prior to Visit  Medication Sig Dispense Refill  . naproxen (NAPROSYN) 500 MG tablet Take 1 tablet (500 mg total) by mouth 2 (two) times daily with a meal. 30 tablet 0  . omeprazole (PRILOSEC) 40 MG capsule TAKE 1 CAPSULE(40 MG) BY MOUTH DAILY 90 capsule 0   No current facility-administered medications on file prior to visit.   Allergies  Allergen Reactions  . Codeine Nausea Only   Social History   Socioeconomic History  .  Marital status: Married    Spouse name: Not on file  . Number of children: 2  . Years of education: Not on file  . Highest education level: Not on file  Occupational History  . Occupation: Architect: Investment banker, [employer]  Tobacco Use  . Smoking status: Former Smoker    Quit date: 02/01/1994    Years since quitting: 26.5  . Smokeless tobacco: Never Used  . Tobacco comment: Quit x 20 years  Substance and Sexual Activity  . 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  . Drug use: No  . Sexual activity: Not on file  Other  Topics Concern  . Not on file  Social History Narrative  . Not on file   Social Determinants of Health   Financial Resource Strain:   . Difficulty of Paying Living Expenses: Not on file  Food Insecurity:   . Worried About Programme researcher, broadcasting/film/video in the Last Year: Not on file  . Ran Out of Food in the Last Year: Not on file  Transportation Needs:   . Lack of Transportation (Medical): Not on file  . Lack of Transportation (Non-Medical): Not on file  Physical Activity:   . Days of Exercise per Week: Not on file  . Minutes of Exercise per Session: Not on file  Stress:   . Feeling of Stress : Not on file  Social Connections:   . Frequency of Communication with Friends and Family: Not on file  . Frequency of Social Gatherings with Friends and Family: Not on file  . Attends Religious Services: Not on file  . Active Member of Clubs or Organizations: Not on file  . Attends Banker Meetings: Not on file  . Marital Status: Not on file  Intimate Partner Violence:   . Fear of Current or Ex-Partner: Not on file  . Emotionally Abused: Not on file  . Physically Abused: Not on file  . Sexually Abused: Not on file      Review of Systems  Musculoskeletal: Positive for back pain.  All other systems reviewed and are negative.      Objective:   Physical Exam Vitals reviewed.  Constitutional:      General: He is not in acute distress.    Appearance: He is well-developed. He is not diaphoretic.  HENT:     Head: Normocephalic and atraumatic.     Right Ear: External ear normal.     Left Ear: External ear normal.     Nose: Nose normal.     Mouth/Throat:     Pharynx: No oropharyngeal exudate.  Eyes:     General: No scleral icterus.       Right eye: No discharge.        Left eye: No discharge.     Conjunctiva/sclera: Conjunctivae normal.     Pupils: Pupils are equal, round, and reactive to light.  Neck:     Thyroid: No thyromegaly.     Vascular: No JVD.     Trachea: No  tracheal deviation.  Cardiovascular:     Rate and Rhythm: Normal rate and regular rhythm.     Heart sounds: Normal heart sounds. No murmur heard.  No friction rub. No gallop.  Pulmonary:     Effort: Pulmonary effort is normal. No respiratory distress.     Breath sounds: Normal breath sounds. No stridor. No wheezing or rales.  Chest:     Chest wall: No tenderness.  Abdominal:     General: Bowel sounds are normal. There is no distension.     Palpations: Abdomen is soft. There is no mass.     Tenderness: There is no abdominal tenderness. There is no guarding or rebound.     Hernia: There is no hernia in the left inguinal area.  Genitourinary:    Penis: Normal and circumcised. No erythema or discharge.      Testes: Normal.        Right: Mass or tenderness not present.        Left: Mass or tenderness not present.  Musculoskeletal:     Cervical back: Normal range of motion and neck supple.     Lumbar back: Tenderness and bony tenderness present. Decreased range of motion.  Lymphadenopathy:     Cervical: No cervical adenopathy.  Skin:    Findings: No rash.  Neurological:     Mental Status: He is alert and oriented to person, place, and time.     Cranial Nerves: No cranial nerve deficit.     Motor: No abnormal muscle tone.     Coordination: Coordination normal.     Deep Tendon Reflexes: Reflexes are normal and symmetric.  Psychiatric:        Behavior: Behavior normal.        Thought Content: Thought content normal.        Judgment: Judgment normal.         Assessment & Plan:  Contact with and (suspected) exposure to covid-19 - Plan: SARS CoV2 Serology(COVID19) AB(IgG,IgM),Immunoassay  Cough - Plan: DG Chest 2 View  Patient had close exposure to COVID-19 in August.  Check Covid serologies to determine if he has natural immunity.  This may help the patient make decision regarding vaccine and when to get it.  Given the chronic cough for 3 months I do recommend a chest x-ray  however I suspect upper airway irritant cough that I will treat with cough medication, Flonase, and maximizing treatment for GERD if his chest x-ray is normal.

## 2020-07-30 LAB — SARS COV-2 SEROLOGY(COVID-19)AB(IGG,IGM),IMMUNOASSAY
SARS CoV-2 AB IgG: NEGATIVE
SARS CoV-2 IgM: NEGATIVE

## 2020-09-05 ENCOUNTER — Other Ambulatory Visit: Payer: Self-pay | Admitting: Family Medicine

## 2020-10-16 ENCOUNTER — Other Ambulatory Visit: Payer: Self-pay | Admitting: Family Medicine

## 2020-10-23 ENCOUNTER — Telehealth: Payer: Self-pay

## 2020-10-23 NOTE — Telephone Encounter (Signed)
Pt called stating he is still having the same breathing problems had at last encounter in Nov. Chest xray was ordered for pt then, did not follow through. Pt has been instructed to go have the xray done and follow up with pcp. Pt verbalized understanding. Pt states the otc flonase does aid for now but is concerned about upcoming allergy season

## 2020-10-24 ENCOUNTER — Ambulatory Visit (HOSPITAL_COMMUNITY)
Admission: RE | Admit: 2020-10-24 | Discharge: 2020-10-24 | Disposition: A | Discharge status: Home / Self Care | Source: Ambulatory Visit | Attending: Family Medicine | Admitting: Family Medicine

## 2020-10-24 ENCOUNTER — Other Ambulatory Visit: Payer: Self-pay

## 2020-10-24 ENCOUNTER — Telehealth: Payer: Self-pay

## 2020-10-24 DIAGNOSIS — R059 Cough, unspecified: Secondary | ICD-10-CM | POA: Diagnosis not present

## 2020-10-24 NOTE — Telephone Encounter (Signed)
I have called pt back and relayed the results of the CXR on his VM. He is to keep his appt Monday to see what the next steps are.

## 2020-10-24 NOTE — Telephone Encounter (Signed)
CXR is clear, no pneumonia or fluid in the lungs or visible abnormality.  Would need to be seen to try to figure out what is going on.

## 2020-10-24 NOTE — Telephone Encounter (Signed)
Pt called wanting the Xray results.   Struggling for HA and coughs. SHOB and wheezing as well.   Pt is scheduled for Monday 10/27/20 at 2:15pm.

## 2020-10-27 ENCOUNTER — Other Ambulatory Visit: Payer: Self-pay

## 2020-10-27 ENCOUNTER — Ambulatory Visit (INDEPENDENT_AMBULATORY_CARE_PROVIDER_SITE_OTHER): Admitting: Family Medicine

## 2020-10-27 ENCOUNTER — Encounter: Payer: Self-pay | Admitting: Family Medicine

## 2020-10-27 VITALS — BP 110/80 | HR 74 | Temp 98.2°F | Ht 69.0 in | Wt 200.0 lb

## 2020-10-27 DIAGNOSIS — R058 Other specified cough: Secondary | ICD-10-CM

## 2020-10-27 MED ORDER — FAMOTIDINE 40 MG PO TABS: 40.0000 mg | ORAL_TABLET | Freq: Every day | ORAL | 1 refills | Status: DC

## 2020-10-27 MED ORDER — PANTOPRAZOLE SODIUM 40 MG PO TBEC: 40.0000 mg | DELAYED_RELEASE_TABLET | Freq: Two times a day (BID) | ORAL | 1 refills | Status: DC

## 2020-10-27 MED ORDER — TRAMADOL HCL 50 MG PO TABS: 50.0000 mg | ORAL_TABLET | Freq: Four times a day (QID) | ORAL | 0 refills | Status: DC | PRN

## 2020-10-27 MED ORDER — FLUTICASONE PROPIONATE 50 MCG/ACT NA SUSP: 2.0000 | Freq: Every day | NASAL | 6 refills | Status: DC

## 2020-10-27 NOTE — Progress Notes (Signed)
Subjective:    Patient ID: Bryan Khan, male    DOB: September 30, 1970, 50 y.o.   MRN: [id number]  07/29/20 He also reports a cough ever since August.  Occasionally is productive of clear and yellow sputum.  However mainly is just an irritant.  Today on his exam, his lungs are completely clear there is no wheezes crackles or rales so I feel is most likely an irritant cough either due to postnasal drainage or more likely due to his reflux.  At that time, my plan was: Given the chronic cough for 3 months I do recommend a chest x-ray however I suspect upper airway irritant cough that I will treat with cough medication, Flonase, and maximizing treatment for GERD if his chest x-ray is normal.  10/27/20 Reviewing the patient's records, he reported a chronic cough in 2018.  At that time he also had globus sensation.  We referred him to ENT who performed laryngoscopy and saw erythema consistent with acid reflux.  Ever since that time, the patient has been on omeprazole.  He also sleeps with the head of his bed elevated.  However he is reporting a constant cough.  Is been now over 4 months.  The cough is nonproductive.  He does have some pain in his chest simply due to the frequency of the coughing.  It tends to be worse at night when he lies down.  He denies any hemoptysis.  He denies any pleurisy.  He denies any fevers or chills or weight loss.  He denies any sinus pain.  He is on Flonase that he uses for postnasal drip.  Due to headaches that has been getting from the cough he has been taking excessive amounts of ibuprofen.  He states that he has been taking more than 12 200 mg ibuprofen tablets a day! Past Medical History:  Diagnosis Date  . Allergy   . GERD (gastroesophageal reflux disease)   . Reflux esophagitis   . Warts, genital 11/2006   Current Outpatient Medications on File Prior to Visit  Medication Sig Dispense Refill  . naproxen (NAPROSYN) 500 MG tablet Take 1 tablet (500 mg total) by mouth 2  (two) times daily with a meal. (Patient not taking: Reported on 07/29/2020) 30 tablet 0  . omeprazole (PRILOSEC) 40 MG capsule TAKE 1 CAPSULE(40 MG) BY MOUTH DAILY 90 capsule 0   No current facility-administered medications on file prior to visit.   Allergies  Allergen Reactions  . Codeine Nausea Only   Social History   Socioeconomic History  . Marital status: Married    Spouse name: Not on file  . Number of children: 2  . Years of education: Not on file  . Highest education level: Not on file  Occupational History  . Occupation: Architect: Investment banker, [employer]  Tobacco Use  . Smoking status: Former Smoker    Quit date: 02/01/1994    Years since quitting: 26.7  . Smokeless tobacco: Never Used  . Tobacco comment: Quit x 20 years  Substance and Sexual Activity  . 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  . Drug use: No  . Sexual activity: Not on file  Other Topics Concern  . Not on file  Social History  Narrative  . Not on file   Social Determinants of Health   Financial Resource Strain: Not on file  Food Insecurity: Not on file  Transportation Needs: Not on file  Physical Activity: Not on file  Stress: Not on file  Social Connections: Not on file  Intimate Partner Violence: Not on file      Review of Systems  Musculoskeletal: Positive for back pain.  All other systems reviewed and are negative.      Objective:   Physical Exam Vitals reviewed.  Constitutional:      General: He is not in acute distress.    Appearance: He is well-developed. He is not diaphoretic.  HENT:     Head: Normocephalic and atraumatic.     Right Ear: Tympanic membrane and ear canal normal.     Left Ear: Tympanic membrane and ear canal normal.     Nose: Nose normal.      Right Turbinates: Not enlarged or swollen.     Left Turbinates: Not enlarged or swollen.     Right Sinus: No maxillary sinus tenderness or frontal sinus tenderness.     Left Sinus: No maxillary sinus tenderness or frontal sinus tenderness.     Mouth/Throat:     Pharynx: No oropharyngeal exudate.  Eyes:     Conjunctiva/sclera: Conjunctivae normal.     Pupils: Pupils are equal, round, and reactive to light.  Neck:     Thyroid: No thyromegaly.     Vascular: No JVD.     Trachea: No tracheal deviation.  Cardiovascular:     Rate and Rhythm: Normal rate and regular rhythm.     Heart sounds: Normal heart sounds. No murmur heard. No friction rub. No gallop.   Pulmonary:     Effort: Pulmonary effort is normal. No respiratory distress.     Breath sounds: Normal breath sounds. No stridor. No wheezing or rales.  Chest:     Chest wall: No tenderness.  Neurological:     Mental Status: He is alert and oriented to person, place, and time.     Cranial Nerves: No cranial nerve deficit.     Motor: No abnormal muscle tone.     Coordination: Coordination normal.     Deep Tendon Reflexes: Reflexes are normal and symmetric.  Psychiatric:        Behavior: Behavior normal.        Thought Content: Thought content normal.        Judgment: Judgment normal.         Assessment & Plan:  Upper airway cough syndrome  Discontinue omeprazole and replace with Protonix 40 mg twice a day.  Add famotidine 40 mg p.o. nightly.  Continue Flonase.  Use tramadol 50 mg every 8 hours as needed for cough.  Allow medications 3 to 4 weeks to take effect and then reassess.  If cough is still present and not improving after 4 weeks, I would consult pulmonology.  Recent chest x-ray was normal.

## 2020-11-07 ENCOUNTER — Encounter: Payer: Self-pay | Admitting: Family Medicine

## 2021-01-26 ENCOUNTER — Ambulatory Visit (INDEPENDENT_AMBULATORY_CARE_PROVIDER_SITE_OTHER): Admitting: Nurse Practitioner

## 2021-01-26 ENCOUNTER — Encounter: Payer: Self-pay | Admitting: Nurse Practitioner

## 2021-01-26 ENCOUNTER — Other Ambulatory Visit: Payer: Self-pay

## 2021-01-26 VITALS — BP 108/80 | HR 67 | Temp 98.2°F | Ht 69.0 in | Wt 203.0 lb

## 2021-01-26 DIAGNOSIS — Z0289 Encounter for other administrative examinations: Secondary | ICD-10-CM | POA: Insufficient documentation

## 2021-01-26 DIAGNOSIS — M5441 Lumbago with sciatica, right side: Secondary | ICD-10-CM

## 2021-01-26 DIAGNOSIS — M5442 Lumbago with sciatica, left side: Secondary | ICD-10-CM

## 2021-01-26 MED ORDER — PREDNISONE 10 MG PO TABS: 10.0000 mg | ORAL_TABLET | Freq: Every day | ORAL | 0 refills | Status: DC
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
## 2021-01-29 ENCOUNTER — Telehealth: Payer: Self-pay

## 2021-01-29 DIAGNOSIS — M5442 Lumbago with sciatica, left side: Secondary | ICD-10-CM

## 2021-01-29 DIAGNOSIS — M5441 Lumbago with sciatica, right side: Secondary | ICD-10-CM

## 2021-01-29 MED ORDER — CYCLOBENZAPRINE HCL 5 MG PO TABS: 5.0000 mg | ORAL_TABLET | Freq: Three times a day (TID) | ORAL | 0 refills | Status: DC | PRN

## 2021-01-29 NOTE — Telephone Encounter (Signed)
No, he has an appt with Ortho

## 2021-01-29 NOTE — Telephone Encounter (Signed)
Okay great - some of the Orthopedic folks cross over and also deal with spine and neck.  Thank you!

## 2021-01-29 NOTE — Telephone Encounter (Signed)
Flexeril sent into pharmacy.  Does he have appt with Neurosurgery?

## 2021-02-03 ENCOUNTER — Telehealth: Payer: Self-pay | Admitting: Family Medicine

## 2021-02-03 NOTE — Telephone Encounter (Signed)
Patient called to follow up on recent referral to [facility]; patient unable to schedule since prior auth not received from insurance company.  Please advise asap at [phone].

## 2021-02-03 NOTE — Telephone Encounter (Signed)
Can you look into this. Thank you

## 2021-02-04 NOTE — Telephone Encounter (Signed)
Call placed to patient and patient made aware.  

## 2021-02-18 ENCOUNTER — Telehealth: Payer: Self-pay | Admitting: Family Medicine

## 2021-02-18 NOTE — Telephone Encounter (Signed)
Pt came in needing a Functional Capacity Certificated filled out by NP. Pt needs form filled out by 06/02. Please call when ready for pick up. Form placed in NP green folder.  Cb#: [phone]

## 2021-02-19 ENCOUNTER — Telehealth: Payer: Self-pay

## 2021-02-19 NOTE — Telephone Encounter (Signed)
Form completed and signed

## 2021-02-19 NOTE — Telephone Encounter (Signed)
Forms received.  Please find out if patient has seen [facility] and request records from them.  I am not able to fill out paperwork without this information.

## 2021-02-19 NOTE — Telephone Encounter (Signed)
Sent telephone encounter to NP addressing pt need. Pt is aware forms cannot be filled out without ortho notes. Appt for ortho is 02/26/21

## 2021-02-19 NOTE — Telephone Encounter (Signed)
Called pt, he will be in today to pick up forms, copy made for scan.

## 2021-04-23 ENCOUNTER — Other Ambulatory Visit: Payer: Self-pay | Admitting: Family Medicine

## 2021-07-20 ENCOUNTER — Other Ambulatory Visit: Payer: Self-pay | Admitting: Family Medicine

## 2021-08-03 ENCOUNTER — Encounter: Payer: Self-pay | Admitting: Family Medicine

## 2021-08-03 ENCOUNTER — Ambulatory Visit (INDEPENDENT_AMBULATORY_CARE_PROVIDER_SITE_OTHER): Admitting: Family Medicine

## 2021-08-03 ENCOUNTER — Other Ambulatory Visit: Payer: Self-pay

## 2021-08-03 VITALS — BP 138/88 | HR 55 | Temp 97.2°F | Resp 18 | Ht 69.0 in | Wt 210.0 lb

## 2021-08-03 DIAGNOSIS — M2550 Pain in unspecified joint: Secondary | ICD-10-CM

## 2021-08-03 NOTE — Progress Notes (Signed)
Subjective:    Patient ID: Bryan Khan, male    DOB: Oct 08, 1970, 50 y.o.   MRN: [id number]  Patient is requesting a referral to a rheumatologist.  He has been dealing with chronic back pain and has been told that he has degenerative disc disease.  He also states that he hurts in numerous other joints around his body.  He states that he hurts everywhere.  He hurts in the joints of his hands including the PIP joints and the DIP joints.  He hurts in his shoulders bilaterally.  He hurts in his hips bilaterally, his knees bilaterally and his ankles.  He denies any muscle soreness.  He denies any rash.  He denies any fevers or chills or night sweats.  He denies any bruising or epistaxis or melena or hematochezia.  He denies any recent weight loss. Past Medical History:  Diagnosis Date   Allergy    GERD (gastroesophageal reflux disease)    Reflux esophagitis    Warts, genital 11/2006   Current Outpatient Medications on File Prior to Visit  Medication Sig Dispense Refill   fluticasone (FLONASE) 50 MCG/ACT nasal spray      omeprazole (PRILOSEC) 40 MG capsule TAKE 1 CAPSULE(40 MG) BY MOUTH DAILY 90 capsule 0   No current facility-administered medications on file prior to visit.   Allergies  Allergen Reactions   Codeine Nausea Only   Social History   Socioeconomic History   Marital status: Legally Separated    Spouse name: Not on file   Number of children: 2   Years of education: Not on file   Highest education level: Not on file  Occupational History   Occupation: [employer] FT    Employer: [employer]  Tobacco Use   Smoking status: Former    Types: Cigarettes    Quit date: 02/01/1994    Years since quitting: 27.5   Smokeless tobacco: Never   Tobacco comments:    Quit x 20 years  Substance and Sexual Activity   Alcohol use: Yes    Alcohol/week: 2.0 standard drinks    Types: 2 Standard drinks or equivalent per week    Comment: minimal now, heavy for 15 years (12 or  more beers daily), limited use for previous 7 years, 1-2 drinks every 2-4 weeks.   Drug use: No   Sexual activity: Not on file  Other Topics Concern   Not on file  Social History Narrative   ** Merged History Encounter **       Social Determinants of Health   Financial Resource Strain: Not on file  Food Insecurity: Not on file  Transportation Needs: Not on file  Physical Activity: Not on file  Stress: Not on file  Social Connections: Not on file  Intimate Partner Violence: Not on file      Review of Systems  Musculoskeletal:  Positive for back pain.  All other systems reviewed and are negative.     Objective:   Physical Exam Vitals reviewed.  Constitutional:      General: He is not in acute distress.    Appearance: He is well-developed. He is not diaphoretic.  HENT:     Head: Normocephalic and atraumatic.     Nose:     Right Turbinates: Not enlarged or swollen.     Left Turbinates: Not enlarged or swollen.     Right Sinus: No maxillary sinus tenderness or frontal sinus tenderness.     Left Sinus: No maxillary sinus tenderness  or frontal sinus tenderness.     Mouth/Throat:     Pharynx: No oropharyngeal exudate.  Neck:     Thyroid: No thyromegaly.     Vascular: No JVD.     Trachea: No tracheal deviation.  Cardiovascular:     Rate and Rhythm: Normal rate and regular rhythm.     Heart sounds: Normal heart sounds. No murmur heard.   No friction rub. No gallop.  Pulmonary:     Effort: Pulmonary effort is normal. No respiratory distress.     Breath sounds: Normal breath sounds. No stridor. No wheezing or rales.  Chest:     Chest wall: No tenderness.  Musculoskeletal:        General: No swelling or deformity.     Right lower leg: No edema.     Left lower leg: No edema.  Skin:    Findings: No erythema.  Neurological:     Mental Status: He is alert and oriented to person, place, and time.     Cranial Nerves: No cranial nerve deficit.     Motor: No abnormal muscle  tone.     Coordination: Coordination normal.     Deep Tendon Reflexes: Reflexes are normal and symmetric.  Psychiatric:        Behavior: Behavior normal.        Thought Content: Thought content normal.        Judgment: Judgment normal.        Assessment & Plan:  Polyarthralgia - Plan: CBC with Differential/Platelet, CK, ANA, COMPLETE METABOLIC PANEL WITH GFR, B. burgdorfi antibodies by WB, Rheumatoid factor, Cyclic Citrul Peptide Antibody, IGG, C-reactive protein, Sedimentation Rate Patient reports pain all over his body and numerous joints.  There is no objective erythema or swelling.  Therefore I will begin with a series of lab test to evaluate for any evidence of autoimmune disease.  Check CBC to evaluate for any leukocytosis.  Check a CK to rule out myositis.  Check an ANA along with a CRP and a sed rate to look for evidence of autoimmune disease.  Check a rheumatoid factor and anti-CCP.  I will also check for Lyme titers.  If lab work is negative, I suspect the patient most likely has fibromyalgia.

## 2021-08-05 LAB — COMPLETE METABOLIC PANEL WITH GFR
AG Ratio: 1.6 (calc) (ref 1.0–2.5)
ALT: 35 U/L (ref 9–46)
AST: 21 U/L (ref 10–40)
Albumin: 4.4 g/dL (ref 3.6–5.1)
Alkaline phosphatase (APISO): 77 U/L (ref 36–130)
BUN: 12 mg/dL (ref 7–25)
CO2: 24 mmol/L (ref 20–32)
Calcium: 9.6 mg/dL (ref 8.6–10.3)
Chloride: 106 mmol/L (ref 98–110)
Creat: 1.2 mg/dL (ref 0.60–1.29)
Globulin: 2.7 g/dL (calc) (ref 1.9–3.7)
Glucose, Bld: 91 mg/dL (ref 65–99)
Potassium: 4.1 mmol/L (ref 3.5–5.3)
Sodium: 142 mmol/L (ref 135–146)
Total Bilirubin: 0.6 mg/dL (ref 0.2–1.2)
Total Protein: 7.1 g/dL (ref 6.1–8.1)
eGFR: 74 mL/min/{1.73_m2} (ref >=60)

## 2021-08-05 LAB — CBC WITH DIFFERENTIAL/PLATELET
Absolute Monocytes: 478 cells/uL (ref 200–950)
Basophils Absolute: 83 cells/uL (ref 0–200)
Basophils Relative: 1.4 %
Eosinophils Absolute: 201 cells/uL (ref 15–500)
Eosinophils Relative: 3.4 %
HCT: 44.3 % (ref 38.5–50.0)
Hemoglobin: 15.3 g/dL (ref 13.2–17.1)
Lymphs Abs: 2401 cells/uL (ref 850–3900)
MCH: 31 pg (ref 27.0–33.0)
MCHC: 34.5 g/dL (ref 32.0–36.0)
MCV: 89.7 fL (ref 80.0–100.0)
MPV: 12.2 fL (ref 7.5–12.5)
Monocytes Relative: 8.1 %
Neutro Abs: 2738 cells/uL (ref 1500–7800)
Neutrophils Relative %: 46.4 %
Platelets: 193 10*3/uL (ref 140–400)
RBC: 4.94 10*6/uL (ref 4.20–5.80)
RDW: 12.1 % (ref 11.0–15.0)
Total Lymphocyte: 40.7 %
WBC: 5.9 10*3/uL (ref 3.8–10.8)

## 2021-08-05 LAB — B. BURGDORFI ANTIBODIES BY WB

## 2021-08-05 LAB — CK: Total CK: 71 U/L (ref 44–196)

## 2021-08-05 LAB — C-REACTIVE PROTEIN: CRP: 13.6 mg/L — ABNORMAL HIGH (ref <8.0)

## 2021-08-05 LAB — RHEUMATOID FACTOR: Rheumatoid fact SerPl-aCnc: <14 IU/mL (ref <14)

## 2021-08-05 LAB — CYCLIC CITRUL PEPTIDE ANTIBODY, IGG: Cyclic Citrullin Peptide Ab: <16 UNITS

## 2021-08-05 LAB — SEDIMENTATION RATE: Sed Rate: 2 mm/h (ref 0–15)

## 2021-08-05 LAB — ANA: Anti Nuclear Antibody (ANA): NEGATIVE

## 2021-08-18 ENCOUNTER — Encounter: Payer: Self-pay | Admitting: Family Medicine

## 2021-08-18 ENCOUNTER — Ambulatory Visit (INDEPENDENT_AMBULATORY_CARE_PROVIDER_SITE_OTHER): Admitting: Family Medicine

## 2021-08-18 ENCOUNTER — Other Ambulatory Visit: Payer: Self-pay

## 2021-08-18 VITALS — BP 118/80 | HR 68 | Ht 69.0 in | Wt 210.0 lb

## 2021-08-18 DIAGNOSIS — M797 Fibromyalgia: Secondary | ICD-10-CM | POA: Diagnosis not present

## 2021-08-18 MED ORDER — PREGABALIN 75 MG PO CAPS: 75.0000 mg | ORAL_CAPSULE | Freq: Two times a day (BID) | ORAL | 0 refills | Status: DC

## 2021-08-18 NOTE — Progress Notes (Signed)
Subjective:    Patient ID: Bryan Khan, male    DOB: 22-Aug-1971, 50 y.o.   MRN: [id number] 08/04/21 Patient is requesting a referral to a rheumatologist.  He has been dealing with chronic back pain and has been told that he has degenerative disc disease.  He also states that he hurts in numerous other joints around his body.  He states that he hurts everywhere.  He hurts in the joints of his hands including the PIP joints and the DIP joints.  He hurts in his shoulders bilaterally.  He hurts in his hips bilaterally, his knees bilaterally and his ankles.  He denies any muscle soreness.  He denies any rash.  He denies any fevers or chills or night sweats.  He denies any bruising or epistaxis or melena or hematochezia.  He denies any recent weight loss.  At that time, my plan was:  Patient reports pain all over his body and numerous joints.  There is no objective erythema or swelling.  Therefore I will begin with a series of lab test to evaluate for any evidence of autoimmune disease.  Check CBC to evaluate for any leukocytosis.  Check a CK to rule out myositis.  Check an ANA along with a CRP and a sed rate to look for evidence of autoimmune disease.  Check a rheumatoid factor and anti-CCP.  I will also check for Lyme titers.  If lab work is negative, I suspect the patient most likely has fibromyalgia.  Office Visit on 08/03/2021  Component Date Value Ref Range Status  . WBC 08/03/2021 5.9  3.8 - 10.8 Thousand/uL Final  . RBC 08/03/2021 4.94  4.20 - 5.80 Million/uL Final  . Hemoglobin 08/03/2021 15.3  13.2 - 17.1 g/dL Final  . HCT 08/03/2021 44.3  38.5 - 50.0 % Final  . MCV 08/03/2021 89.7  80.0 - 100.0 fL Final  . MCH 08/03/2021 31.0  27.0 - 33.0 pg Final  . MCHC 08/03/2021 34.5  32.0 - 36.0 g/dL Final  . RDW 08/03/2021 12.1  11.0 - 15.0 % Final  . Platelets 08/03/2021 193  140 - 400 Thousand/uL Final  . MPV 08/03/2021 12.2  7.5 - 12.5 fL Final  . Neutro Abs 08/03/2021 2,738  1,500 - 7,800  cells/uL Final  . Lymphs Abs 08/03/2021 2,401  850 - 3,900 cells/uL Final  . Absolute Monocytes 08/03/2021 478  200 - 950 cells/uL Final  . Eosinophils Absolute 08/03/2021 201  15 - 500 cells/uL Final  . Basophils Absolute 08/03/2021 83  0 - 200 cells/uL Final  . Neutrophils Relative % 08/03/2021 46.4  % Final  . Total Lymphocyte 08/03/2021 40.7  % Final  . Monocytes Relative 08/03/2021 8.1  % Final  . Eosinophils Relative 08/03/2021 3.4  % Final  . Basophils Relative 08/03/2021 1.4  % Final  . Total CK 08/03/2021 71  44 - 196 U/L Final  . Anti Nuclear Antibody (ANA) 08/03/2021 NEGATIVE  NEGATIVE Final   Comment: ANA IFA is a first line screen for detecting the presence of up to approximately 150 autoantibodies in various autoimmune diseases. A negative ANA IFA result suggests an ANA-associated autoimmune disease is not present at this time, but is not definitive. If there is high clinical suspicion for Sjogren's syndrome, testing for anti-SS-A/Ro antibody should be considered. Anti-Jo-1 antibody should be considered for clinically suspected inflammatory myopathies. . AC-0: Negative . International Consensus on ANA Patterns (https://www.hernandez-brewer.com/) . For additional information, please refer to http://education.[facility].com/faq/FAQ177 (This link is being  provided for informational/ educational purposes only.) .   Marland Kitchen Glucose, Bld 08/03/2021 91  65 - 99 mg/dL Final   Comment: .            Fasting reference interval .   . BUN 08/03/2021 12  7 - 25 mg/dL Final  . Creat 08/03/2021 1.20  0.60 - 1.29 mg/dL Final  . eGFR 08/03/2021 74  > OR = 60 mL/min/1.70m Final   Comment: The eGFR is based on the CKD-EPI 2021 equation. To calculate  the new eGFR from a previous Creatinine or Cystatin C result, go to https://www.kidney.org/professionals/ kdoqi/gfr%5Fcalculator   . BUN/Creatinine Ratio 128/78/6767NOT APPLICABLE  6 - 22 (calc) Final  . Sodium 08/03/2021  142  135 - 146 mmol/L Final  . Potassium 08/03/2021 4.1  3.5 - 5.3 mmol/L Final  . Chloride 08/03/2021 106  98 - 110 mmol/L Final  . CO2 08/03/2021 24  20 - 32 mmol/L Final  . Calcium 08/03/2021 9.6  8.6 - 10.3 mg/dL Final  . Total Protein 08/03/2021 7.1  6.1 - 8.1 g/dL Final  . Albumin 08/03/2021 4.4  3.6 - 5.1 g/dL Final  . Globulin 08/03/2021 2.7  1.9 - 3.7 g/dL (calc) Final  . AG Ratio 08/03/2021 1.6  1.0 - 2.5 (calc) Final  . Total Bilirubin 08/03/2021 0.6  0.2 - 1.2 mg/dL Final  . Alkaline phosphatase (APISO) 08/03/2021 77  36 - 130 U/L Final  . AST 08/03/2021 21  10 - 40 U/L Final  . ALT 08/03/2021 35  9 - 46 U/L Final  . B burgdorferi IgG Abs (IB) 08/03/2021 NEGATIVE  NEGATIVE Final  . Lyme Disease 18 kD IgG 08/03/2021 NON-REACTIVE   Final  . Lyme Disease 23 kD IgG 08/03/2021 NON-REACTIVE   Final  . Lyme Disease 28 kD IgG 08/03/2021 NON-REACTIVE   Final  . Lyme Disease 30 kD IgG 08/03/2021 NON-REACTIVE   Final  . Lyme Disease 39 kD IgG 08/03/2021 NON-REACTIVE   Final  . Lyme Disease 41 kD IgG 08/03/2021 NON-REACTIVE   Final  . Lyme Disease 45 kD IgG 08/03/2021 NON-REACTIVE   Final  . Lyme Disease 58 kD IgG 08/03/2021 NON-REACTIVE   Final  . Lyme Disease 66 kD IgG 08/03/2021 NON-REACTIVE   Final  . Lyme Disease 93 kD IgG 08/03/2021 NON-REACTIVE   Final  . B burgdorferi IgM Abs (IB) 08/03/2021 NEGATIVE  NEGATIVE Final  . Lyme Disease 23 kD IgM 08/03/2021 NON-REACTIVE   Final  . Lyme Disease 39 kD IgM 08/03/2021 NON-REACTIVE   Final  . Lyme Disease 41 kD IgM 08/03/2021 NON-REACTIVE   Final   Comment: Lyme immunoblot testing should only be performed on samples from patients who have had a Positive or Equivocal result in a screening assay. . As per CDC criteria, a Lyme disease IgG Immunoblot must show reactivity to at least 5 of 10 specific borrelial proteins to be considered positive; similarly, a  positive Lyme disease IgM immunoblot requires reactivity to 2 of 3 specific  borrelial proteins. Although considered negative, IgG reactivity to fewer specific borrelial proteins or IgM reactivity to only 1 protein may indicate recent B. burgdorferi infection and warrant testing of a later sample. A positive IgM but negative IgG result obtained more than a month after onset of symptoms likely represents a false- positive IgM result rather than acute Lyme disease. In rare instances, Lyme disease immunoblot reactivity may represent antibodies induced by exposure to other spirochetes.  .Marland KitchenRhuematoid fact SerPl-aCnc  08/03/2021 <14  <14 IU/mL Final  . Cyclic Citrullin Peptide Ab 08/03/2021 <16  UNITS Final   Comment: Reference Range Negative:            <20 Weak Positive:       20-39 Moderate Positive:   40-59 Strong Positive:     >59 .   Marland Kitchen CRP 08/03/2021 13.6 (H)  <8.0 mg/L Final  . Sed Rate 08/03/2021 2  0 - 15 mm/h Final   08/18/21 Patient is here today to discuss his lab work.  I went over each lab test with the patient individually and explained the implication.  The only outlier was a mildly elevated CRP but I feel this is likely a mild elevation not related to a rheumatologic disease given his sed rate was 2.  His ANA was negative, rheumatoid factor was negative, CCP was negative, Lyme titers were negative, CK level was negative, and CBC was normal. Past Medical History:  Diagnosis Date  . Allergy   . GERD (gastroesophageal reflux disease)   . Reflux esophagitis   . Warts, genital 11/2006   Current Outpatient Medications on File Prior to Visit  Medication Sig Dispense Refill  . fluticasone (FLONASE) 50 MCG/ACT nasal spray     . omeprazole (PRILOSEC) 40 MG capsule TAKE 1 CAPSULE(40 MG) BY MOUTH DAILY 90 capsule 0   No current facility-administered medications on file prior to visit.   Allergies  Allergen Reactions  . Codeine Nausea Only   Social History   Socioeconomic History  . Marital status: Legally Separated    Spouse name: Not on  file  . Number of children: 2  . Years of education: Not on file  . Highest education level: Not on file  Occupational History  . Occupation: Oceanographer: [employer]  Tobacco Use  . Smoking status: Former    Types: Cigarettes    Quit date: 02/01/1994    Years since quitting: 27.5  . Smokeless tobacco: Never  . Tobacco comments:    Quit x 20 years  Substance and Sexual Activity  . 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  . Drug use: No  . Sexual activity: Not on file  Other Topics Concern  . Not on file  Social History Narrative   ** Merged History Encounter **       Social Determinants of Health   Financial Resource Strain: Not on file  Food Insecurity: Not on file  Transportation Needs: Not on file  Physical Activity: Not on file  Stress: Not on file  Social Connections: Not on file  Intimate Partner Violence: Not on file      Review of Systems  Musculoskeletal:  Positive for back pain.  All other systems reviewed and are negative.     Objective:   Physical Exam Vitals reviewed.  Constitutional:      General: He is not in acute distress.    Appearance: He is well-developed. He is not diaphoretic.  HENT:     Head: Normocephalic and atraumatic.     Nose:     Right Turbinates: Not enlarged or swollen.     Left Turbinates: Not enlarged or swollen.     Right Sinus: No maxillary sinus tenderness  or frontal sinus tenderness.     Left Sinus: No maxillary sinus tenderness or frontal sinus tenderness.  Neck:     Thyroid: No thyromegaly.     Vascular: No JVD.     Trachea: No tracheal deviation.  Cardiovascular:     Rate and Rhythm: Normal rate and regular rhythm.     Heart sounds: Normal heart sounds. No murmur heard.   No friction rub. No  gallop.  Pulmonary:     Effort: Pulmonary effort is normal. No respiratory distress.     Breath sounds: Normal breath sounds. No stridor. No wheezing or rales.  Chest:     Chest wall: No tenderness.  Musculoskeletal:        General: No swelling or deformity.     Right lower leg: No edema.     Left lower leg: No edema.  Neurological:     Mental Status: He is alert.     Motor: No abnormal muscle tone.     Deep Tendon Reflexes: Reflexes are normal and symmetric.        Assessment & Plan:  Fibromyalgia We discussed a referral to rheumatology for a second opinion versus an empiric trial of Lyrica for fibromyalgia.  Based on his reassuring lab work I have decided to try Lyrica 75 mg twice daily for 1 month to see if this helps his pain.  At that point we will reassess.  Could also consider Cymbalta if the Lyrica is not beneficial.

## 2021-10-25 ENCOUNTER — Other Ambulatory Visit: Payer: Self-pay | Admitting: Family Medicine

## 2021-10-28 ENCOUNTER — Encounter: Payer: Self-pay | Admitting: Nurse Practitioner

## 2021-10-28 ENCOUNTER — Other Ambulatory Visit: Payer: Self-pay

## 2021-10-28 ENCOUNTER — Ambulatory Visit (INDEPENDENT_AMBULATORY_CARE_PROVIDER_SITE_OTHER): Admitting: Nurse Practitioner

## 2021-10-28 VITALS — BP 134/82 | HR 96 | Ht 69.0 in | Wt 208.0 lb

## 2021-10-28 DIAGNOSIS — M5441 Lumbago with sciatica, right side: Secondary | ICD-10-CM

## 2021-10-28 DIAGNOSIS — M5442 Lumbago with sciatica, left side: Secondary | ICD-10-CM | POA: Diagnosis not present

## 2021-10-28 MED ORDER — PREGABALIN 75 MG PO CAPS: 75.0000 mg | ORAL_CAPSULE | Freq: Two times a day (BID) | ORAL | 0 refills | Status: DC

## 2021-10-28 MED ORDER — TRAMADOL HCL 50 MG PO TABS: 50.0000 mg | ORAL_TABLET | Freq: Four times a day (QID) | ORAL | 0 refills | Status: AC | PRN
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
## 2022-04-17 IMAGING — DX DG CHEST 2V
2 series · 2 of 2 positions shown · non-contrast
Comparison: [DATE]

CLINICAL DATA: Cough

EXAM:
CHEST - 2 VIEW

[chest pa]
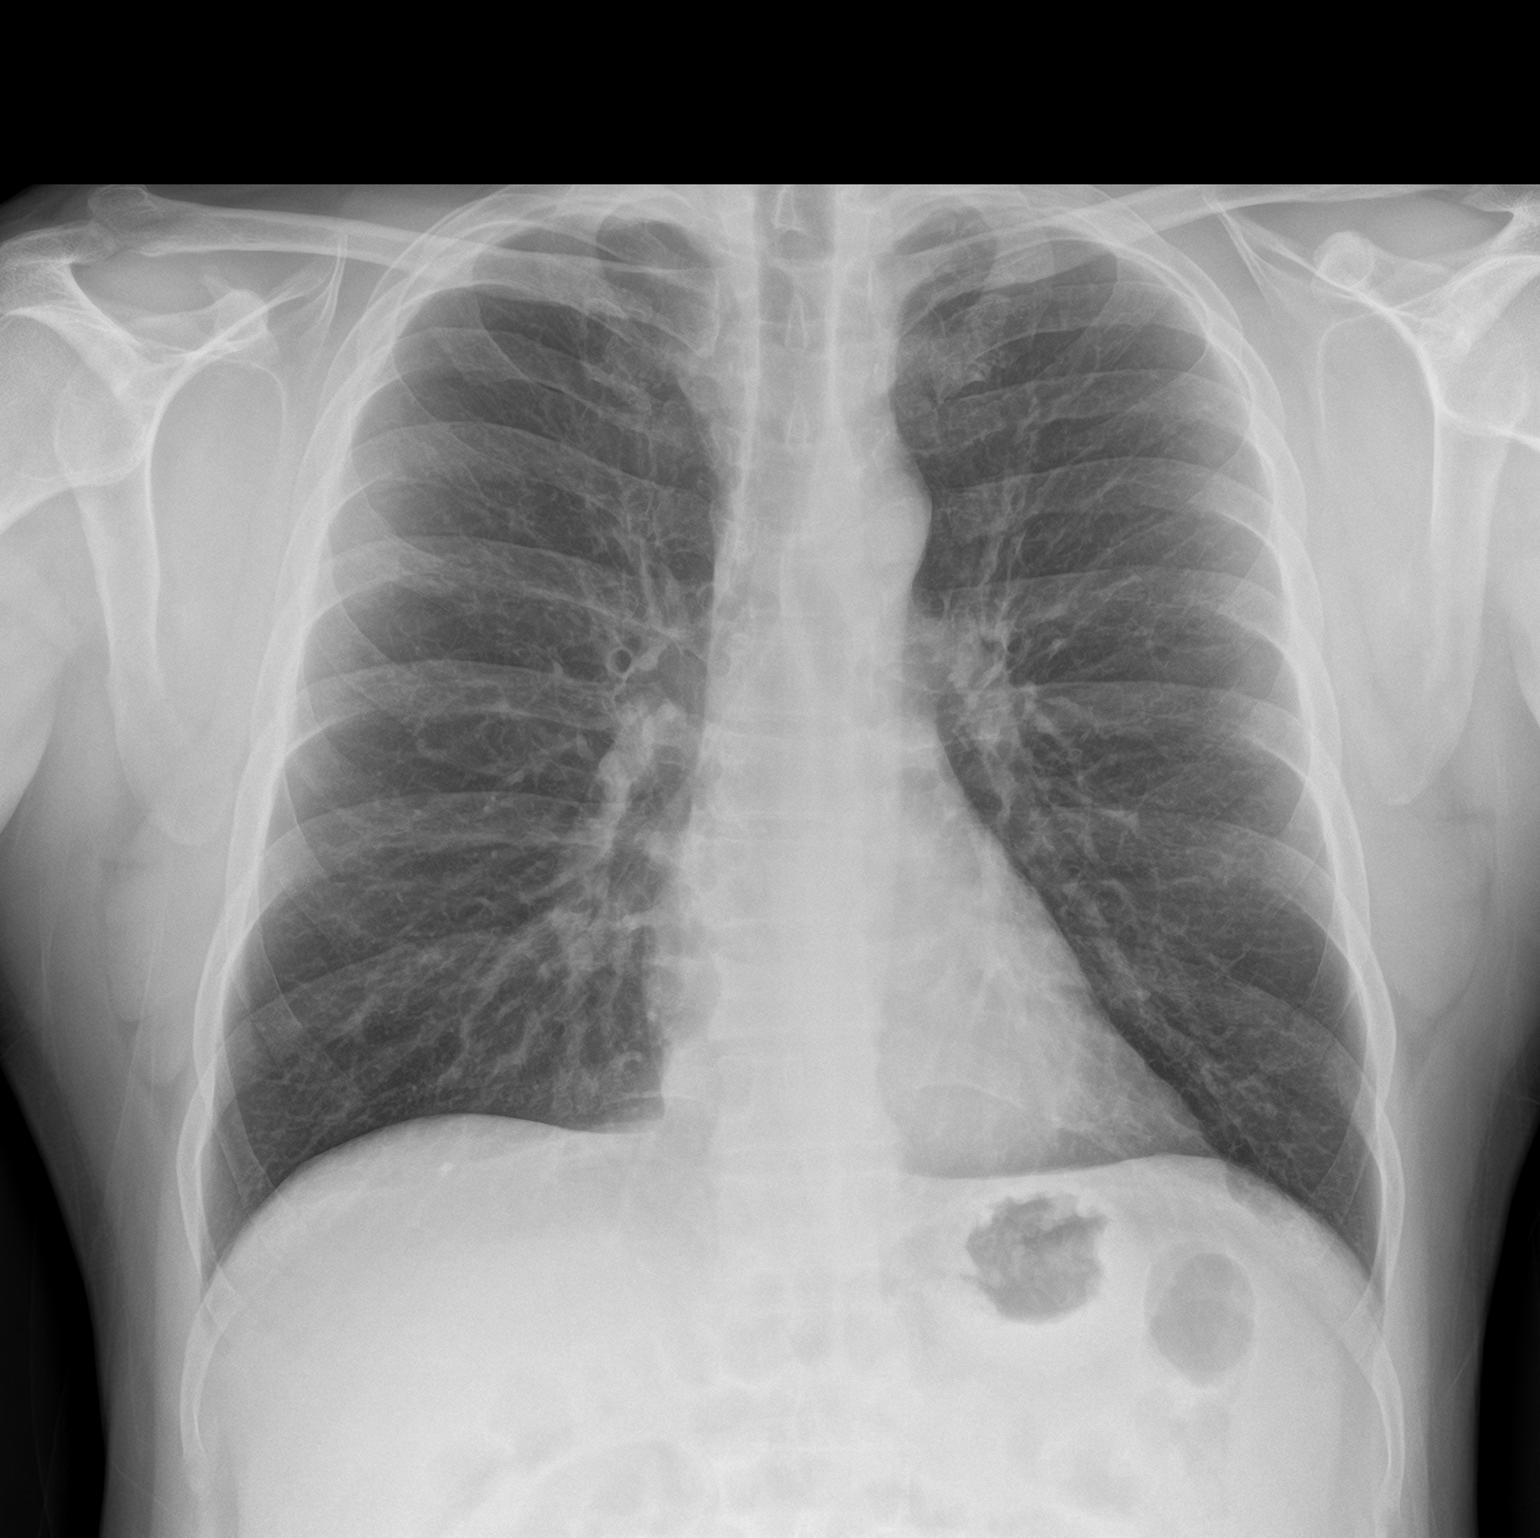

[chest lat]
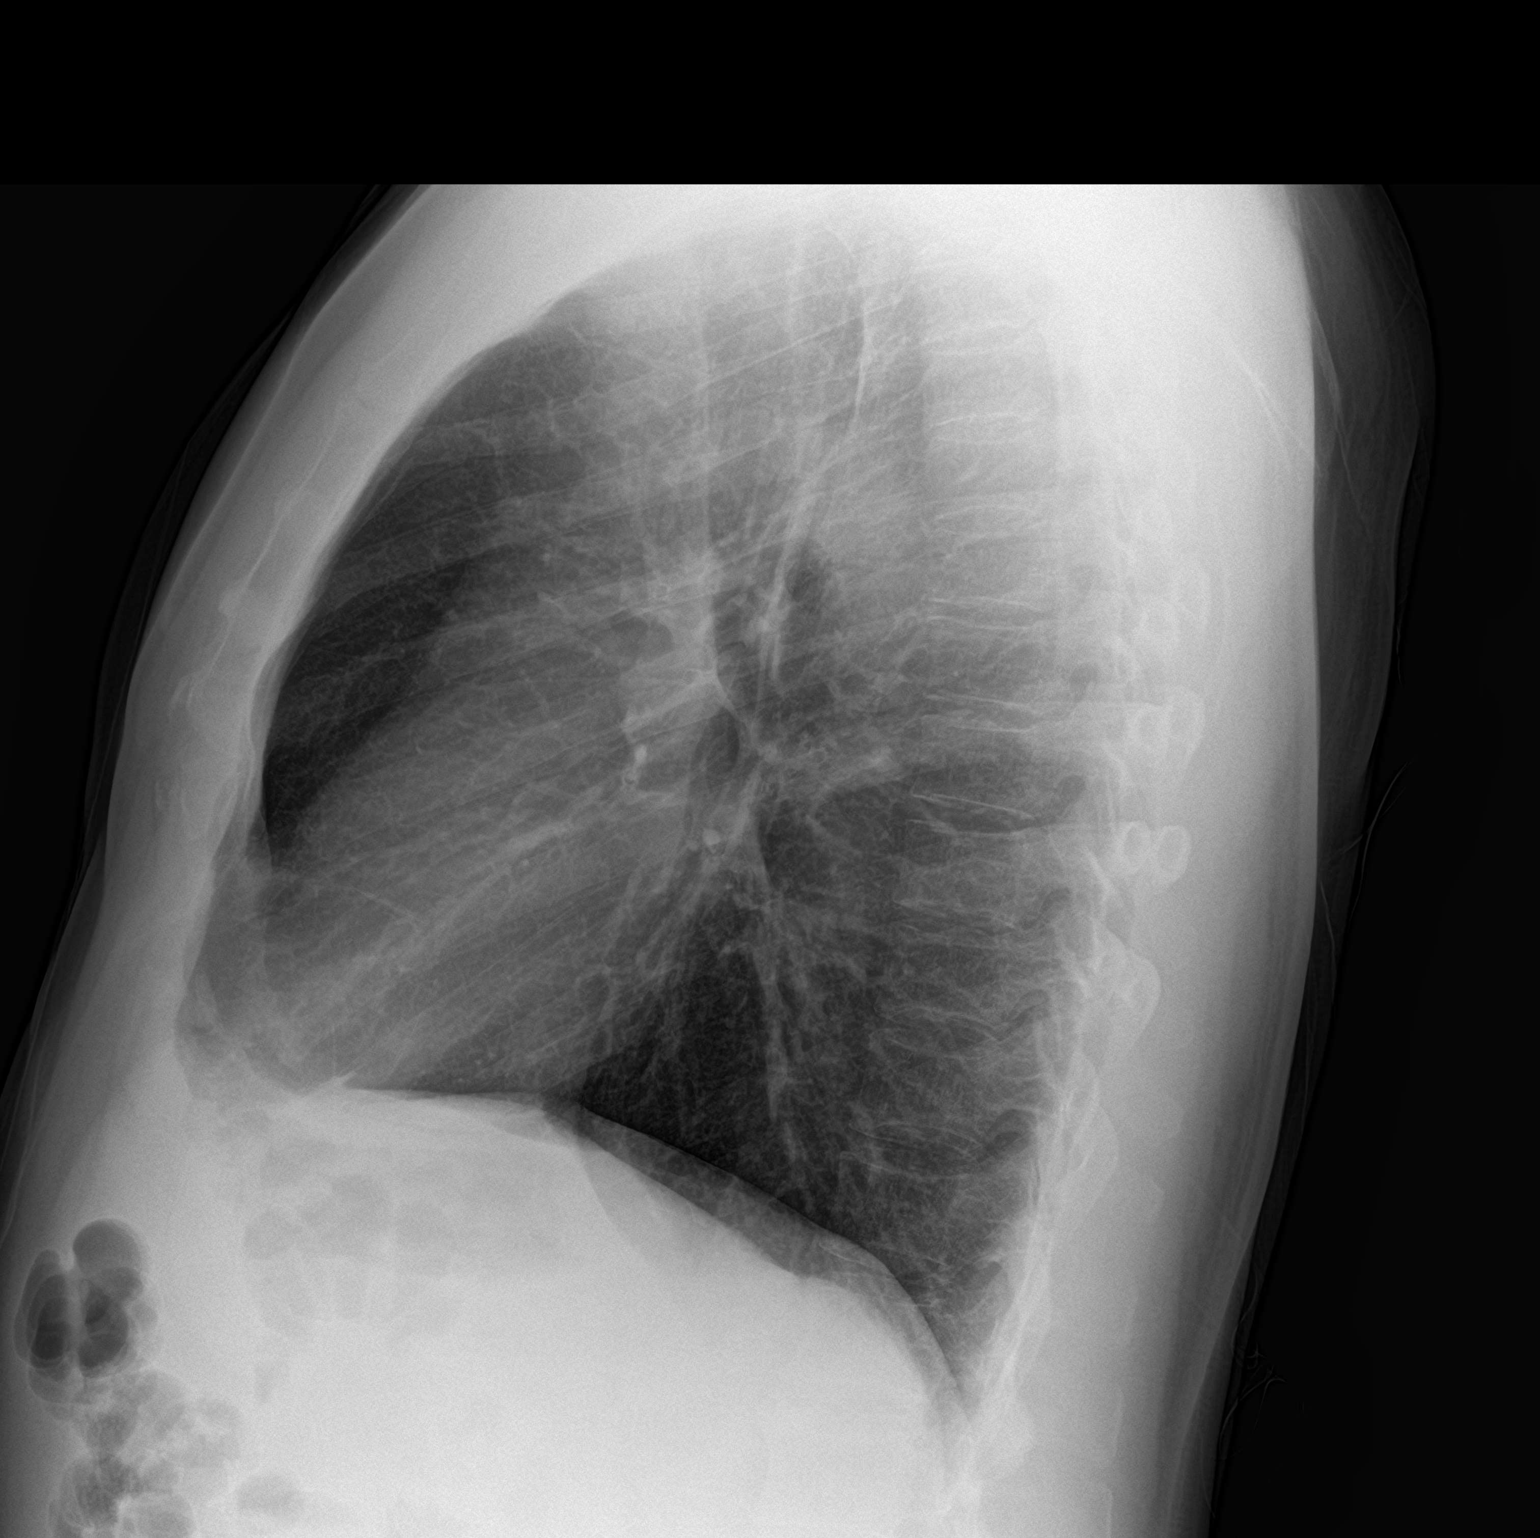

[2 of 2 positions shown; findings below may reference images not displayed]

FINDINGS: The heart size and mediastinal contours are within normal limits.
Both lungs are clear. The visualized skeletal structures are
unremarkable.
IMPRESSION: No active cardiopulmonary disease.

## 2022-06-14 ENCOUNTER — Ambulatory Visit (INDEPENDENT_AMBULATORY_CARE_PROVIDER_SITE_OTHER): Admitting: Family Medicine

## 2022-06-14 VITALS — BP 126/82 | HR 77 | Temp 98.7°F | Ht 69.0 in | Wt 208.0 lb

## 2022-06-14 DIAGNOSIS — R5383 Other fatigue: Secondary | ICD-10-CM

## 2022-06-14 DIAGNOSIS — G4710 Hypersomnia, unspecified: Secondary | ICD-10-CM

## 2022-06-14 MED ORDER — FLUTICASONE PROPIONATE 50 MCG/ACT NA SUSP: 2.0000 | Freq: Every day | NASAL | 11 refills | Status: AC

## 2022-06-14 NOTE — Progress Notes (Signed)
Subjective:    Patient ID: Bryan Khan, male    DOB: April 15, 1971, 51 y.o.   MRN: [id number] 08/04/21 Patient is requesting a referral to a rheumatologist.  He has been dealing with chronic back pain and has been told that he has degenerative disc disease.  He also states that he hurts in numerous other joints around his body.  He states that he hurts everywhere.  He hurts in the joints of his hands including the PIP joints and the DIP joints.  He hurts in his shoulders bilaterally.  He hurts in his hips bilaterally, his knees bilaterally and his ankles.  He denies any muscle soreness.  He denies any rash.  He denies any fevers or chills or night sweats.  He denies any bruising or epistaxis or melena or hematochezia.  He denies any recent weight loss.  At that time, my plan was:  Patient reports pain all over his body and numerous joints.  There is no objective erythema or swelling.  Therefore I will begin with a series of lab test to evaluate for any evidence of autoimmune disease.  Check CBC to evaluate for any leukocytosis.  Check a CK to rule out myositis.  Check an ANA along with a CRP and a sed rate to look for evidence of autoimmune disease.  Check a rheumatoid factor and anti-CCP.  I will also check for Lyme titers.  If lab work is negative, I suspect the patient most likely has fibromyalgia.  No visits with results within 1 Month(s) from this visit.  Latest known visit with results is:  Office Visit on 08/03/2021  Component Date Value Ref Range Status   WBC 08/03/2021 5.9  3.8 - 10.8 Thousand/uL Final   RBC 08/03/2021 4.94  4.20 - 5.80 Million/uL Final   Hemoglobin 08/03/2021 15.3  13.2 - 17.1 g/dL Final   HCT 08/03/2021 44.3  38.5 - 50.0 % Final   MCV 08/03/2021 89.7  80.0 - 100.0 fL Final   MCH 08/03/2021 31.0  27.0 - 33.0 pg Final   MCHC 08/03/2021 34.5  32.0 - 36.0 g/dL Final   RDW 08/03/2021 12.1  11.0 - 15.0 % Final   Platelets 08/03/2021 193  140 - 400 Thousand/uL Final   MPV  08/03/2021 12.2  7.5 - 12.5 fL Final   Neutro Abs 08/03/2021 2,738  1,500 - 7,800 cells/uL Final   Lymphs Abs 08/03/2021 2,401  850 - 3,900 cells/uL Final   Absolute Monocytes 08/03/2021 478  200 - 950 cells/uL Final   Eosinophils Absolute 08/03/2021 201  15 - 500 cells/uL Final   Basophils Absolute 08/03/2021 83  0 - 200 cells/uL Final   Neutrophils Relative % 08/03/2021 46.4  % Final   Total Lymphocyte 08/03/2021 40.7  % Final   Monocytes Relative 08/03/2021 8.1  % Final   Eosinophils Relative 08/03/2021 3.4  % Final   Basophils Relative 08/03/2021 1.4  % Final   Total CK 08/03/2021 71  44 - 196 U/L Final   Anti Nuclear Antibody (ANA) 08/03/2021 NEGATIVE  NEGATIVE Final   Comment: ANA IFA is a first line screen for detecting the presence of up to approximately 150 autoantibodies in various autoimmune diseases. A negative ANA IFA result suggests an ANA-associated autoimmune disease is not present at this time, but is not definitive. If there is high clinical suspicion for Sjogren's syndrome, testing for anti-SS-A/Ro antibody should be considered. Anti-Jo-1 antibody should be considered for clinically suspected inflammatory myopathies. . AC-0: Negative .  International Consensus on ANA Patterns (https://www.hernandez-brewer.com/) . For additional information, please refer to http://education.[facility].com/faq/FAQ177 (This link is being provided for informational/ educational purposes only.) .    Glucose, Bld 08/03/2021 91  65 - 99 mg/dL Final   Comment: .            Fasting reference interval .    BUN 08/03/2021 12  7 - 25 mg/dL Final   Creat 08/03/2021 1.20  0.60 - 1.29 mg/dL Final   eGFR 08/03/2021 74  > OR = 60 mL/min/1.85m Final   Comment: The eGFR is based on the CKD-EPI 2021 equation. To calculate  the new eGFR from a previous Creatinine or Cystatin C result, go to https://www.kidney.org/professionals/ kdoqi/gfr%5Fcalculator    BUN/Creatinine Ratio  170/17/7939NOT APPLICABLE  6 - 22 (calc) Final   Sodium 08/03/2021 142  135 - 146 mmol/L Final   Potassium 08/03/2021 4.1  3.5 - 5.3 mmol/L Final   Chloride 08/03/2021 106  98 - 110 mmol/L Final   CO2 08/03/2021 24  20 - 32 mmol/L Final   Calcium 08/03/2021 9.6  8.6 - 10.3 mg/dL Final   Total Protein 08/03/2021 7.1  6.1 - 8.1 g/dL Final   Albumin 08/03/2021 4.4  3.6 - 5.1 g/dL Final   Globulin 08/03/2021 2.7  1.9 - 3.7 g/dL (calc) Final   AG Ratio 08/03/2021 1.6  1.0 - 2.5 (calc) Final   Total Bilirubin 08/03/2021 0.6  0.2 - 1.2 mg/dL Final   Alkaline phosphatase (APISO) 08/03/2021 77  36 - 130 U/L Final   AST 08/03/2021 21  10 - 40 U/L Final   ALT 08/03/2021 35  9 - 46 U/L Final   B burgdorferi IgG Abs (IB) 08/03/2021 NEGATIVE  NEGATIVE Final   Lyme Disease 18 kD IgG 08/03/2021 NON-REACTIVE   Final   Lyme Disease 23 kD IgG 08/03/2021 NON-REACTIVE   Final   Lyme Disease 28 kD IgG 08/03/2021 NON-REACTIVE   Final   Lyme Disease 30 kD IgG 08/03/2021 NON-REACTIVE   Final   Lyme Disease 39 kD IgG 08/03/2021 NON-REACTIVE   Final   Lyme Disease 41 kD IgG 08/03/2021 NON-REACTIVE   Final   Lyme Disease 45 kD IgG 08/03/2021 NON-REACTIVE   Final   Lyme Disease 58 kD IgG 08/03/2021 NON-REACTIVE   Final   Lyme Disease 66 kD IgG 08/03/2021 NON-REACTIVE   Final   Lyme Disease 93 kD IgG 08/03/2021 NON-REACTIVE   Final   B burgdorferi IgM Abs (IB) 08/03/2021 NEGATIVE  NEGATIVE Final   Lyme Disease 23 kD IgM 08/03/2021 NON-REACTIVE   Final   Lyme Disease 39 kD IgM 08/03/2021 NON-REACTIVE   Final   Lyme Disease 41 kD IgM 08/03/2021 NON-REACTIVE   Final   Comment: Lyme immunoblot testing should only be performed on samples from patients who have had a Positive or Equivocal result in a screening assay. . As per CDC criteria, a Lyme disease IgG Immunoblot must show reactivity to at least 5 of 10 specific borrelial proteins to be considered positive; similarly, a  positive Lyme disease IgM immunoblot  requires reactivity to 2 of 3 specific borrelial proteins. Although considered negative, IgG reactivity to fewer specific borrelial proteins or IgM reactivity to only 1 protein may indicate recent B. burgdorferi infection and warrant testing of a later sample. A positive IgM but negative IgG result obtained more than a month after onset of symptoms likely represents a false- positive IgM result rather than acute Lyme disease. In rare instances, Lyme disease immunoblot  reactivity may represent antibodies induced by exposure to other spirochetes.  Marland Kitchen    Rhuematoid fact SerPl-aCnc 08/03/2021 <14  <14 IU/mL Final   Cyclic Citrullin Peptide Ab 08/03/2021 <16  UNITS Final   Comment: Reference Range Negative:            <20 Weak Positive:       20-39 Moderate Positive:   40-59 Strong Positive:     >59 .    CRP 08/03/2021 13.6 (H)  <8.0 mg/L Final   Sed Rate 08/03/2021 2  0 - 15 mm/h Final   08/18/21 Patient is here today to discuss his lab work.  I went over each lab test with the patient individually and explained the implication.  The only outlier was a mildly elevated CRP but I feel this is likely a mild elevation not related to a rheumatologic disease given his sed rate was 2.  His ANA was negative, rheumatoid factor was negative, CCP was negative, Lyme titers were negative, CK level was negative, and CBC was normal.  At that time, my  plan was:  We discussed a referral to rheumatology for a second opinion versus an empiric trial of Lyrica for fibromyalgia.  Based on his reassuring lab work I have decided to try Lyrica 75 mg twice daily for 1 month to see if this helps his pain.  At that point we will reassess.  Could also consider Cymbalta if the Lyrica is not beneficial.  06/14/22 Patient saw no benefit from the Lyrica so he discontinued the medication.  He states that he is now tired all the time.  He reports feeling both hypersomnolence as well as chronic fatigue.  He states that his  girlfriend says he stops breathing during his sleep.  He has awakened gasping for air on several occasions.  He is concerned that he may have sleep apnea.  In addition to the hypersomnolence and fatigue he reports decreased concentration.  I asked the patient if he feels like he could be depressed and he states that the chronic pain in his back does weigh on him but he denies depression. Past Medical History:  Diagnosis Date   Allergy    GERD (gastroesophageal reflux disease)    Reflux esophagitis    Warts, genital 11/2006   Current Outpatient Medications on File Prior to Visit  Medication Sig Dispense Refill   fluticasone (FLONASE) 50 MCG/ACT nasal spray      omeprazole (PRILOSEC) 40 MG capsule TAKE 1 CAPSULE(40 MG) BY MOUTH DAILY 90 capsule 3   pregabalin (LYRICA) 75 MG capsule Take 1 capsule (75 mg total) by mouth 2 (two) times daily. 60 capsule 0   No current facility-administered medications on file prior to visit.   Allergies  Allergen Reactions   Codeine Nausea Only   Social History   Socioeconomic History   Marital status: Divorced    Spouse name: Not on file   Number of children: 2   Years of education: Not on file   Highest education level: Not on file  Occupational History   Occupation: [employer]    Employer: [employer]  Tobacco Use   Smoking status: Former    Types: Cigarettes    Quit date: 02/01/1994    Years since quitting: 28.3   Smokeless tobacco: Never   Tobacco comments:    Quit x 20 years  Substance and Sexual Activity   Alcohol use: Yes    Alcohol/week: 2.0 standard drinks of alcohol  Types: 2 Standard drinks or equivalent per week    Comment: minimal now, 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   Drug use: No   Sexual activity: Not on file  Other Topics Concern   Not on file  Social History Narrative   ** Merged History Encounter **       Social Determinants of  Health   Financial Resource Strain: Not on file  Food Insecurity: Not on file  Transportation Needs: Not on file  Physical Activity: Not on file  Stress: Not on file  Social Connections: Not on file  Intimate Partner Violence: Not on file      Review of Systems  Musculoskeletal:  Positive for back pain.  All other systems reviewed and are negative.      Objective:   Physical Exam Vitals reviewed.  Constitutional:      General: He is not in acute distress.    Appearance: He is well-developed. He is not diaphoretic.  HENT:     Head: Normocephalic and atraumatic.     Nose:     Right Turbinates: Not enlarged or swollen.     Left Turbinates: Not enlarged or swollen.     Right Sinus: No maxillary sinus tenderness or frontal sinus tenderness.     Left Sinus: No maxillary sinus tenderness or frontal sinus tenderness.  Neck:     Thyroid: No thyromegaly.     Vascular: No JVD.     Trachea: No tracheal deviation.  Cardiovascular:     Rate and Rhythm: Normal rate and regular rhythm.     Heart sounds: Normal heart sounds. No murmur heard.    No friction rub. No gallop.  Pulmonary:     Effort: Pulmonary effort is normal. No respiratory distress.     Breath sounds: Normal breath sounds. No stridor. No wheezing or rales.  Chest:     Chest wall: No tenderness.  Abdominal:     General: Bowel sounds are normal. There is no distension.     Palpations: Abdomen is soft.     Tenderness: There is no abdominal tenderness. There is no guarding.  Musculoskeletal:        General: No swelling or deformity.     Right lower leg: No edema.     Left lower leg: No edema.  Lymphadenopathy:     Cervical: No cervical adenopathy.  Skin:    Coloration: Skin is not jaundiced.     Findings: No erythema or lesion.  Neurological:     Mental Status: He is alert.     Motor: No abnormal muscle tone.     Deep Tendon Reflexes: Reflexes are normal and symmetric.         Assessment & Plan:  Fatigue,  unspecified type - Plan: Vitamin B12, TSH, Ambulatory referral to Sleep Studies  Hypersomnolence Given his hypersomnia, fatigue, and witnessed apneic episodes, I will consult a sleep specialist for sleep study to evaluate for possible sleep apnea.  I will also complete the laboratory work-up by checking a B12 level and a TSH.  If his sleep study is normal and if the labs are normal, patient may be dealing with chronic fatigue syndrome or perhaps fatigue related to fibromyalgia

## 2022-06-21 LAB — METHYLMALONIC ACID, SERUM: Methylmalonic Acid, Quant: 132 nmol/L (ref 87–318)

## 2022-06-21 LAB — TEST AUTHORIZATION

## 2022-06-21 LAB — TSH: TSH: 1.18 mIU/L (ref 0.40–4.50)

## 2022-06-21 LAB — VITAMIN B12: Vitamin B-12: 256 pg/mL (ref 200–1100)

## 2022-07-14 ENCOUNTER — Ambulatory Visit (INDEPENDENT_AMBULATORY_CARE_PROVIDER_SITE_OTHER): Admitting: Neurology

## 2022-07-14 ENCOUNTER — Encounter: Payer: Self-pay | Admitting: Neurology

## 2022-07-14 VITALS — BP 143/89 | HR 41 | Ht 69.0 in | Wt 213.0 lb

## 2022-07-14 DIAGNOSIS — M2550 Pain in unspecified joint: Secondary | ICD-10-CM

## 2022-07-14 DIAGNOSIS — R5382 Chronic fatigue, unspecified: Secondary | ICD-10-CM

## 2022-07-14 DIAGNOSIS — G4730 Sleep apnea, unspecified: Secondary | ICD-10-CM | POA: Diagnosis not present

## 2022-07-14 DIAGNOSIS — R0683 Snoring: Secondary | ICD-10-CM | POA: Diagnosis not present

## 2022-07-14 DIAGNOSIS — R519 Headache, unspecified: Secondary | ICD-10-CM

## 2022-07-14 DIAGNOSIS — F5104 Psychophysiologic insomnia: Secondary | ICD-10-CM

## 2022-07-14 MED ORDER — TRAZODONE HCL 50 MG PO TABS: 50.0000 mg | ORAL_TABLET | Freq: Every evening | ORAL | 5 refills | Status: DC | PRN
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
## 2022-07-14 NOTE — Patient Instructions (Signed)
Healthy Living: Sleep In this video, you will learn why sleep is an important part of a healthy lifestyle. To view the content, go to this web address: [email or web address]  This video will expire on: 05/25/2024. If you need access to this video following this date, please reach out to the healthcare provider who assigned it to you. This information is not intended to replace advice given to you by your health care provider. Make sure you discuss any questions you have with your health care provider. Elsevier Patient Education  2023 Elsevier Inc.  

## 2022-07-15 ENCOUNTER — Telehealth: Payer: Self-pay | Admitting: Neurology

## 2022-07-15 NOTE — Telephone Encounter (Signed)
tricare pending faxed notes  

## 2022-07-18 LAB — TESTOSTERONE, FREE, TOTAL, SHBG
Sex Hormone Binding: 51.4 nmol/L (ref 19.3–76.4)
Testosterone, Free: 6.6 pg/mL — ABNORMAL LOW (ref 7.2–24.0)
Testosterone: 550 ng/dL (ref 264–916)

## 2022-07-18 LAB — PROTEIN ELECTROPHORESIS, SERUM
A/G Ratio: 1.4 (ref 0.7–1.7)
Albumin ELP: 4.1 g/dL (ref 2.9–4.4)
Alpha 1: 0.2 g/dL (ref 0.0–0.4)
Alpha 2: 0.6 g/dL (ref 0.4–1.0)
Beta: 1.1 g/dL (ref 0.7–1.3)
Gamma Globulin: 1 g/dL (ref 0.4–1.8)
Globulin, Total: 3 g/dL (ref 2.2–3.9)
Total Protein: 7.1 g/dL (ref 6.0–8.5)

## 2022-07-19 NOTE — Progress Notes (Signed)
Low free testosterone. But normal sex binding hormone level and normal testosterone ( bound to protein) level. Normal protein e-phoresis.

## 2022-07-20 ENCOUNTER — Telehealth: Payer: Self-pay | Admitting: *Deleted

## 2022-07-20 NOTE — Telephone Encounter (Signed)
-----   Message from Larey Seat, MD sent at 07/19/2022  3:15 PM EDT ----- Low free testosterone. But normal sex binding hormone level and normal testosterone ( bound to protein) level. Normal protein e-phoresis.

## 2022-07-27 NOTE — Telephone Encounter (Signed)
Split- [employer] auth: [id number] (exp. 07/09/22 to 01/05/23).  Patient is scheduled at [facility] for 08/01/22 at 9 pm.  Emailed patient the information.

## 2022-08-01 ENCOUNTER — Ambulatory Visit (INDEPENDENT_AMBULATORY_CARE_PROVIDER_SITE_OTHER): Admitting: Neurology

## 2022-08-01 DIAGNOSIS — R5382 Chronic fatigue, unspecified: Secondary | ICD-10-CM

## 2022-08-01 DIAGNOSIS — G4730 Sleep apnea, unspecified: Secondary | ICD-10-CM

## 2022-08-01 DIAGNOSIS — F5104 Psychophysiologic insomnia: Secondary | ICD-10-CM

## 2022-08-01 DIAGNOSIS — R0683 Snoring: Secondary | ICD-10-CM | POA: Diagnosis not present

## 2022-08-01 DIAGNOSIS — M2550 Pain in unspecified joint: Secondary | ICD-10-CM

## 2022-08-10 ENCOUNTER — Telehealth: Payer: Self-pay | Admitting: *Deleted

## 2022-08-10 NOTE — Procedures (Signed)
[facility] POLYSOMNOGRAPHY  INTERPRETATION REPORT   STUDY DATE:  08/01/2022     PATIENT NAME:  Bryan Khan         DATE OF BIRTH:  [birth year]  PATIENT ID:  [id number]    TYPE OF STUDY:  PSG  READING PHYSICIAN: Melvyn Novasarmen Magic Mohler, MD REFERRED BY: Lynnea FerrierWarren Pickard, MD SCORING TECHNICIAN: Margaretann LovelessSheena Fields, RPSGT   HISTORY: Bryan Khan is a 51 y.o. Caucasian male patient was seen in consultation upon referral on 07/14/2022 from Dr. 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 I have had colleagues with Covid on and off, and I felt sick, stayed home and felt ill, but did not test for COVID" "I was tested as a school kid in the 1970 s and never diagnosed with ADD, but if I lack stimulation, motivation, I am drifting off- .I now snore a lot and my GF witnessed apneas". Bryan Khan has a past medical history of rhinitis, sinusitis, bronchitis Allergy, GERD (gastroesophageal reflux disease), Reflux esophagitis, and Fatigue, unspecified.    ADDITIONAL INFORMATION:  The Epworth Sleepiness Scale was endorsed at 10 /24 points (scores above or equal to 10 are suggestive of hypersomnolence). FSS endorsed at  47 /63 points. RLS frequency; 4- 8 out of 30 nights.   Height: 69 in Weight: 213 lb (BMI 31) Neck Size: 17 in MEDICATIONS: Flonase, Prilosec  TECHNICAL DESCRIPTION: A registered sleep technologist ( RPSGT)  was in attendance for the duration of the recording.  Data collection, scoring, video monitoring, and reporting were performed in compliance with the AASM Manual for the Scoring of Sleep and Associated Events; (Hypopnea is scored based on the criteria listed in Section VIII D. 1b in the AASM Manual V2.6 using a 4% oxygen desaturation rule  or Hypopnea is scored based on the criteria listed in Section VIII D. 1a in the AASM Manual V2.6 using 3% oxygen desaturation and /or arousal rule).   SLEEP CONTINUITY AND SLEEP ARCHITECTURE:  Lights-out was at 21:48: and lights-on at  04:57:, with  7.2 minutes of recording time . Total sleep time ( TST) was 299.5 minutes with a decreased sleep efficiency at 69.7%. Wake after sleep onset (WASO) time accounted for .58 minutes. Sleep latency was increased at 72.0 minutes.  REM sleep latency was increased at 177.5 minutes. Of the total sleep time, the percentage of stage N1 sleep was 5.7%, stage N2 sleep was 75%, stage N3 sleep was 12.0%, and REM sleep was 7.7%. BODY POSITION:  TST was divided between the following sleep positions: in supine for 88 minutes (29%), in non-supine 212 minutes (71%); right 125 minutes (42%), left 86 minutes (29%), and prone 00 minutes (0%).  There were 2 Stage R periods observed on this study night ( no REM sleep in supine) , 24 awakenings (i.e. transitions to Stage W from any sleep stage), and 75 total stage transitions.   RESPIRATORY MONITORING:   Based on CMS criteria (using a 4% oxygen desaturation rule for scoring hypopneas), there were 0 apneas (0 obstructive; 0 central; 0 mixed), and 1 hypopnea.   The Apnea index was 0.0/h. Hypopnea index was 0.2/h. The AHI or apnea-hypopnea index was 0.2 overall. There were 0 respiratory effort-related arousals (RERAs).  Snoring was only present in supine sleep position. There were 0 occurrences of Cheyne Stokes breathing.   OXIMETRY: Oxyhemoglobin Saturation Nadir during sleep was at  90% from a mean of 93%.  Of the Total sleep time (TST) hypoxemia (<89%) was present for 0.0 minutes, or 0.0% of total sleep time.  LIMB MOVEMENTS: There were 40 periodic limb movements of sleep (8.0/h), of which 0 (0.0/h) were associated with an arousal. AROUSAL: There were 66 arousals in total, for an arousal index of 13 arousals/hour.  Of these, 1 was  identified as respiratory-related arousals (0 /h), 0 were PLM-related arousals (0 /h), and 75 were non-specific arousals (15 /h). EEG:  PSG EEG was of normal amplitude and frequency, with symmetric manifestation of sleep stages. EKG: The electrocardiogram showed NSR.  The average heart rate during sleep was 67 bpm.  The heart rate during sleep varied between a minimum of 56 and  a maximum of  91 bpm. AUDIO and VIDEO: no complex movements, vocalizations or phonation.    IMPRESSION: 1) Sleep disordered breathing was not present. There was no significant degree of snoring, nor of hypoxia seen.  2) Insomnia, Total sleep time was reduced at 299.5 minutes.  Sleep efficiency was decreased at 69.7%.   Sleep fragmentation was noted- by nonspecific or spontaneous arousals, some followed a limb movement (not PLMs) and some a snore (microarousals) .   RECOMMENDATIONS: Unfortunately , I  have not been able to determine the cause of sleep fragmentation, but it is not sleep apnea or RLS.  Could it be pain due to myalgia? Depression? anxiety?  Would he be open to a regular trial of trazodone?   Melvyn Novas, MD            General Information  Name: Bryan Khan, Bryan Khan BMI: 31.45 Physician: ,   ID: [id number] Height: 69.0 in Technician: Margaretann Loveless  Sex: Male Weight: 213.0 lb Record: [id number]  Age: 51 [14-Jan-1971] Date: 08/01/2022 Scorer: Margaretann Loveless  Medical & Medication History    Bryan Khan is a 51 y.o. Caucasian male patient was seen in consultation upon referral on 07/14/2022 from Dr. Tanya Nones, MD, for a sleep medicine evaluation- . Chief concern according to patient : " I am getting much more fatigued, work now from home on a computer all day, trouble to focus, There is no distraction, it's me and my dog".I started with this fatigue 8 month ago and wok form home for 2 months now". I have had colleagues with Covid on and off, and I felt sick, stayed home and felt ill, but did not test  for COVID> I was tested as a school kid in the 1970 s and never diagnosed with ADD, but I lack stimulation, motivation.I now snore a lot and my GF witnessed apneas, sleeping on his back Bryan Khan has a past medical history of rhinitis, sinusitis, bronchitis Allergy, GERD (gastroesophageal reflux disease), Reflux esophagitis, and Fatigue, unspecified. Aleda Grana, Prilosec   Sleep Disorder      Comments   The patient came into the lab for a split night study. The patient was not split due to not reaching an AHI of 15 per MD's order. Per the patient he took Trazodone prior to arriving to the sleep lab. EKG kept in NSR. No restroom trips. PLM's noted. Mild to moderate snoring. The patient slept supine and lateral. All sleep stages observed. No respiratory events witnessed.     Lights out: 09:48:03 PM Lights on: 04:57:32 AM  Time Total Supine Side Prone Upright  Recording (TRT) 7h 9.10m 2h 32.60m 4h 37.55m 0h 0.69m 0h 0.27m  Sleep (TST) 4h 59.63m 1h 28.78m 3h 31.76m 0h 0.71m 0h 0.20m   Latency N1 N2 N3 REM Onset Per. Slp. Eff.  Actual 1h 12.58m 1h 13.43m 3h 27.33m 2h 57.46m 1h 12.28m 1h 17.74m 69.73%   Stg Dur Wake N1 N2 N3 REM  Total 38.5 17.0 223.5 36.0 23.0  Supine 7.5 4.0 84.0 0.0 0.0  Side 31.0 13.0 139.5 36.0 23.0  Prone 0.0 0.0 0.0 0.0 0.0  Upright 0.0 0.0 0.0 0.0 0.0   Stg % Wake N1 N2 N3 REM  Total 11.4 5.7 74.6 12.0 7.7  Supine 2.2 1.3 28.0 0.0 0.0  Side 9.2 4.3 46.6 12.0 7.7  Prone 0.0 0.0 0.0 0.0 0.0  Upright 0.0 0.0 0.0 0.0 0.0     Apnea Summary Sub Supine Side Prone Upright  Total 0 Total 0 0 0 0 0    REM 0 0 0 0 0    NREM 0 0 0 0 0  Obs 0 REM 0 0 0 0 0    NREM 0 0 0 0 0  Mix 0 REM 0 0 0 0 0    NREM 0 0 0 0 0  Cen 0 REM 0 0 0 0 0    NREM 0 0 0 0 0   Rera Summary Sub Supine Side Prone Upright  Total 0 Total 0 0 0 0 0    REM 0 0 0 0 0    NREM 0 0 0 0 0   Hypopnea Summary Sub Supine Side Prone Upright  Total 1 Total 1 0 1 0 0    REM 0 0 0 0 0    NREM 1 0 1 0 0   4%  Hypopnea Summary Sub Supine Side Prone Upright  Total (4%) 1 Total 1 0 1 0 0    REM 0 0 0 0 0    NREM 1 0 1 0 0     AHI Total Obs Mix Cen  0.20 Apnea 0.00 0.00 0.00 0.00   Hypopnea 0.20 -- -- --  0.20 Hypopnea (4%) 0.20 -- -- --    Total Supine Side Prone Upright  Position AHI 0.20 0.00 0.28 0.00 0.00  REM AHI 0.00   NREM AHI 0.22   Position RDI 0.20 0.00 0.28 0.00 0.00  REM RDI 0.00   NREM RDI 0.22    4% Hypopnea Total Supine Side Prone Upright  Position AHI (4%) 0.20 0.00 0.28 0.00 0.00  REM AHI (4%) 0.00   NREM AHI (4%) 0.22   Position RDI (4%) 0.20 0.00 0.28 0.00 0.00  REM RDI (4%) 0.00   NREM RDI (4%) 0.22    Desaturation Information Threshold: 2% <100% <90% <80% <70% <60% <50% <40%  Supine 16.0 0.0 0.0 0.0 0.0 0.0 0.0  Side 26.0 1.0 0.0 0.0 0.0 0.0 0.0  Prone 0.0 0.0 0.0 0.0 0.0 0.0 0.0  Upright 0.0 0.0 0.0 0.0 0.0 0.0 0.0  Total 42.0 1.0 0.0 0.0 0.0 0.0 0.0  Index 7.5 0.2 0.0 0.0 0.0 0.0 0.0   Threshold: 3% <100% <90% <80% <70% <60% <50% <40%  Supine 0.0 0.0 0.0 0.0 0.0 0.0 0.0  Side 3.0 1.0 0.0 0.0 0.0 0.0 0.0  Prone 0.0 0.0 0.0 0.0 0.0 0.0 0.0  Upright 0.0 0.0 0.0 0.0 0.0 0.0 0.0  Total 3.0 1.0 0.0 0.0 0.0 0.0 0.0  Index  0.5 0.2 0.0 0.0 0.0 0.0 0.0   Threshold: 4% <100% <90% <80% <70% <60% <50% <40%  Supine 0.0 0.0 0.0 0.0 0.0 0.0 0.0  Side 2.0 1.0 0.0 0.0 0.0 0.0 0.0  Prone 0.0 0.0 0.0 0.0 0.0 0.0 0.0  Upright 0.0 0.0 0.0 0.0 0.0 0.0 0.0  Total 2.0 1.0 0.0 0.0 0.0 0.0 0.0  Index 0.4 0.2 0.0 0.0 0.0 0.0 0.0   Threshold: 4% <100% <90% <80% <70% <60% <50% <40%  Supine 0 0 0 0 0 0 0  Side 2 1 0 0 0 0 0  Prone 0 0 0 0 0 0 0  Upright 0 0 0 0 0 0 0  Total 2 1 0 0 0 0 0   Awakening/Arousal Information # of Awakenings 24  Wake after sleep onset 58.65m  Wake after persistent sleep 55.46m   Arousal Assoc. Arousals Index  Apneas 0 0.0  Hypopneas 1 0.2  Leg Movements 0 0.0  Snore 0 0.0  PTT Arousals 0 0.0  Spontaneous 75 15.0  Total 76 15.2  Leg  Movement Information PLMS LMs Index  Total LMs during PLMS 40 8.0  LMs w/ Microarousals 0 0.0   LM LMs Index  w/ Microarousal 0 0.0  w/ Awakening 0 0.0  w/ Resp Event 0 0.0  Spontaneous 22 4.4  Total 22 4.4

## 2022-08-10 NOTE — Telephone Encounter (Signed)
-----   Message from Melvyn Novas, MD sent at 08/10/2022  1:12 PM EST ----- IMPRESSION: 1) Sleep disordered breathing was not present. There was no significant degree of snoring, nor of hypoxia seen.  2) Insomnia, Total sleep time was reduced at 299.5 minutes.  Sleep efficiency was decreased at 69.7%.   Sleep fragmentation was noted- by nonspecific or spontaneous arousals, some followed a limb movement (not PLMs) and some a snore (microarousals) .   RECOMMENDATIONS: Unfortunately , I  have not been able to determine the cause of sleep fragmentation, but it is not sleep apnea or RLS.  Could it be pain due to myalgia? Depression? anxiety?  Would he be open to a regular trial of trazodone?   Melvyn Novas, MD

## 2022-08-10 NOTE — Progress Notes (Signed)
IMPRESSION: 1) Sleep disordered breathing was not present. There was no significant degree of snoring, nor of hypoxia seen.  2) Insomnia, Total sleep time was reduced at 299.5 minutes.  Sleep efficiency was decreased at 69.7%.   Sleep fragmentation was noted- by nonspecific or spontaneous arousals, some followed a limb movement (not PLMs) and some a snore (microarousals) .   RECOMMENDATIONS: Unfortunately , I  have not been able to determine the cause of sleep fragmentation, but it is not sleep apnea or RLS.  Could it be pain due to myalgia? Depression? anxiety?  Would he be open to a regular trial of trazodone?   Melvyn Novas, MD

## 2022-11-06 ENCOUNTER — Other Ambulatory Visit: Payer: Self-pay | Admitting: Family Medicine

## 2022-11-08 NOTE — Telephone Encounter (Signed)
Requested Prescriptions  Pending Prescriptions Disp Refills   omeprazole (PRILOSEC) 40 MG capsule [Pharmacy Med Name: OMEPRAZOLE 40MG CAPSULES] 90 capsule 3    Sig: TAKE 1 CAPSULE(40 MG) BY MOUTH DAILY     Gastroenterology: Proton Pump Inhibitors Failed - 11/06/2022  3:35 AM      Failed - Valid encounter within last 12 months    Recent Outpatient Visits           1 year ago Acute bilateral low back pain with bilateral sciatica   [place] Eulogio Bear, NP   1 year ago [place] Dennard Schaumann, Cammie Mcgee, MD   1 year ago [place] Susy Frizzle, MD   1 year ago Acute bilateral low back pain with bilateral sciatica   [place] Eulogio Bear, NP   2 years ago Upper airway cough syndrome   [place] Pickard, Cammie Mcgee, MD

## 2023-02-08 ENCOUNTER — Other Ambulatory Visit (HOSPITAL_COMMUNITY): Payer: Self-pay

## 2023-06-06 ENCOUNTER — Emergency Department (HOSPITAL_COMMUNITY): Admission: EM | Admit: 2023-06-06 | Discharge: 2023-06-06 | Discharge status: Against Medical Advice

## 2023-06-06 NOTE — ED Triage Notes (Signed)
Called x1, no answer

## 2023-06-12 ENCOUNTER — Other Ambulatory Visit: Payer: Self-pay | Admitting: Family Medicine

## 2023-06-14 ENCOUNTER — Ambulatory Visit
Admission: RE | Admit: 2023-06-14 | Discharge: 2023-06-14 | Disposition: A | Discharge status: Home / Self Care | Source: Ambulatory Visit | Attending: Family Medicine

## 2023-06-14 ENCOUNTER — Ambulatory Visit: Admitting: Family Medicine

## 2023-06-14 ENCOUNTER — Encounter: Payer: Self-pay | Admitting: Family Medicine

## 2023-06-14 VITALS — BP 116/68 | HR 63 | Temp 98.3°F | Ht 69.0 in | Wt 208.0 lb

## 2023-06-14 DIAGNOSIS — S99912A Unspecified injury of left ankle, initial encounter: Secondary | ICD-10-CM | POA: Insufficient documentation

## 2023-06-14 NOTE — Assessment & Plan Note (Addendum)
Patient presents with swelling to left ankle and foot, bruising to distal left foot, and tenderness on palpation of his medial and lateral malleolus. No sings of vascular compromise. Will obtain xray and refer to Ortho. Continue rest and elevation as well as NSAIDs PRN. Provided with written order for non weight bearing shoe.
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
## 2023-06-14 NOTE — Telephone Encounter (Signed)
Requested medication (s) are due for refill today: Yes  Requested medication (s) are on the active medication list: Yes  Last refill:  06/14/22  Future visit scheduled: No  Notes to clinic:  Prescription expired.    Requested Prescriptions  Pending Prescriptions Disp Refills   fluticasone (FLONASE) 50 MCG/ACT nasal spray [Pharmacy Med Name: FLUTICASONE NASAL SP (120) RX] 16 g 11    Sig: SHAKE LIQUID AND USE 2 SPRAYS IN EACH NOSTRIL DAILY     Ear, Nose, and Throat: Nasal Preparations - Corticosteroids Failed - 06/12/2023  3:35 AM      Failed - Valid encounter within last 12 months    Recent Outpatient Visits           1 year ago Acute bilateral low back pain with bilateral sciatica   [facility] Valentino Nose, NP   1 year ago Fibromyalgia   [facility] Tanya Nones, Priscille Heidelberg, MD   1 year ago Polyarthralgia   [facility] Donita Brooks, MD   2 years ago Acute bilateral low back pain with bilateral sciatica   [facility] Valentino Nose, NP   2 years ago Upper airway cough syndrome   [facility] Pickard, Priscille Heidelberg, MD

## 2023-06-20 ENCOUNTER — Other Ambulatory Visit (INDEPENDENT_AMBULATORY_CARE_PROVIDER_SITE_OTHER)

## 2023-06-20 ENCOUNTER — Ambulatory Visit (INDEPENDENT_AMBULATORY_CARE_PROVIDER_SITE_OTHER): Admitting: Orthopedic Surgery

## 2023-06-20 DIAGNOSIS — S93401A Sprain of unspecified ligament of right ankle, initial encounter: Secondary | ICD-10-CM | POA: Diagnosis not present

## 2023-06-20 DIAGNOSIS — M25572 Pain in left ankle and joints of left foot: Secondary | ICD-10-CM

## 2023-06-21 ENCOUNTER — Encounter: Payer: Self-pay | Admitting: Orthopedic Surgery
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
## 2023-07-19 ENCOUNTER — Encounter: Payer: Self-pay | Admitting: Orthopedic Surgery

## 2023-07-19 ENCOUNTER — Other Ambulatory Visit (INDEPENDENT_AMBULATORY_CARE_PROVIDER_SITE_OTHER)

## 2023-07-19 ENCOUNTER — Ambulatory Visit (INDEPENDENT_AMBULATORY_CARE_PROVIDER_SITE_OTHER): Admitting: Orthopedic Surgery

## 2023-07-19 DIAGNOSIS — M25572 Pain in left ankle and joints of left foot: Secondary | ICD-10-CM
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
## 2023-08-21 ENCOUNTER — Other Ambulatory Visit: Payer: Self-pay | Admitting: Family Medicine

## 2023-08-24 NOTE — Telephone Encounter (Signed)
Requested medication (s) are due for refill today:   Yes  Requested medication (s) are on the active medication list:   Yes  Future visit scheduled:   No    LOV 06/15/2023 for ankle injury,    LOV with blood work 06/14/2022    CPE due   Last ordered: 11/08/2022 #90, 2 refills  Unable to refill because CPE is due    Requested Prescriptions  Pending Prescriptions Disp Refills   omeprazole (PRILOSEC) 40 MG capsule [Pharmacy Med Name: OMEPRAZOLE 40MG  CAPSULES] 90 capsule 2    Sig: TAKE 1 CAPSULE(40 MG) BY MOUTH DAILY     Gastroenterology: Proton Pump Inhibitors Failed - 08/21/2023  3:34 AM      Failed - Valid encounter within last 12 months    Recent Outpatient Visits           1 year ago Acute bilateral low back pain with bilateral sciatica   [facility] Valentino Nose, NP   2 years ago Fibromyalgia   [facility] Tanya Nones, Priscille Heidelberg, MD   2 years ago Polyarthralgia   [facility] Donita Brooks, MD   2 years ago Acute bilateral low back pain with bilateral sciatica   [facility] Valentino Nose, NP   2 years ago Upper airway cough syndrome   [facility] Pickard, Priscille Heidelberg, MD

## 2023-09-12 ENCOUNTER — Other Ambulatory Visit: Payer: Self-pay | Admitting: Family Medicine

## 2024-03-01 ENCOUNTER — Other Ambulatory Visit: Payer: Self-pay | Admitting: Family Medicine

## 2024-06-11 ENCOUNTER — Ambulatory Visit: Payer: Self-pay

## 2024-06-11 ENCOUNTER — Other Ambulatory Visit: Payer: Self-pay | Admitting: Family Medicine

## 2024-06-11 NOTE — Telephone Encounter (Unsigned)
 Copied from CRM [id number]. Topic: Clinical - Medication Refill >> Jun 11, 2024 11:43 AM Emylou G wrote: Medication: omeprazole  (PRILOSEC) 40 MG capsule  Has the patient contacted their pharmacy? No (Agent: If no, request that the patient contact the pharmacy for the refill. If patient does not wish to contact the pharmacy document the reason why and proceed with request.) (Agent: If yes, when and what did the pharmacy advise?)  This is the patient's preferred pharmacy:  [id number] - [place], [place] - [address] [place] [address] Phone: [phone] Fax: [phone]  Is this the correct pharmacy for this prescription? Yes If no, delete pharmacy and type the correct one.   Has the prescription been filled recently? No  Is the patient out of the medication? Yes  Has the patient been seen for an appointment in the last year OR does the patient have an upcoming appointment? No  Can we respond through MyChart? No  Agent: Please be advised that Rx refills may take up to 3 business days. We ask that you follow-up with your pharmacy.

## 2024-06-11 NOTE — Telephone Encounter (Signed)
 FYI Only or Action Required?: FYI only for provider.  Patient was last seen in primary care on 06/14/2023 by Kayla Jeoffrey RAMAN, FNP.  Called Nurse Triage reporting Back Pain.  Symptoms began several years ago.  Interventions attempted: Rest, hydration, or home remedies.  Symptoms are: stable.  Triage Disposition: See PCP When Office is Open (Within 3 Days)  Patient/caregiver understands and will follow disposition?: Yes Reason for Disposition  [1] MODERATE back pain (e.g., interferes with normal activities) AND [2] present > 3 days  Answer Assessment - Initial Assessment Questions Bilateral knee pain, really unstable, feels like he can't trust going up stairs. 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 Having diarrhea every time after eating since January, and heartburn is getting worse. Patient states he has been putting off symptoms for awhile and its all getting worse and piling up at the same time.   1. ONSET: When did the pain begin? (e.g., minutes, hours, days)     On going for years  2. LOCATION: Where does it hurt? (upper, mid or lower back)     Lower back, neck into shoulders  3. SEVERITY: How bad is the pain?  (e.g., Scale 1-10; mild, moderate, or severe)     Moderate, getting worse  4. RADIATION: Does the pain shoot into your legs or somewhere else?     Radiates into lower abdomen   5. MEDICINES: What have you taken so far for the pain? (e.g., nothing, acetaminophen , NSAIDS)     Used to take Advil and Tylenol , does not use anymore  Protocols used: Back Pain-A-AH  Copied from CRM #[id number]. Topic: Clinical - Red Word Triage >> Jun 11, 2024 11:46 AM Emylou G wrote: [facility] that prompted transfer to Nurse Triage: knee pain, giving out, no stability,  back and neck issues, with depression - with chest pain.

## 2024-06-12 ENCOUNTER — Ambulatory Visit (INDEPENDENT_AMBULATORY_CARE_PROVIDER_SITE_OTHER): Admitting: Family Medicine

## 2024-06-12 VITALS — BP 130/82 | HR 62 | Temp 98.4°F | Ht 69.0 in | Wt 205.0 lb

## 2024-06-12 DIAGNOSIS — Z299 Encounter for prophylactic measures, unspecified: Secondary | ICD-10-CM | POA: Insufficient documentation

## 2024-06-12 DIAGNOSIS — F320 Major depressive disorder, single episode, mild: Secondary | ICD-10-CM

## 2024-06-12 DIAGNOSIS — M5136 Other intervertebral disc degeneration, lumbar region with discogenic back pain only: Secondary | ICD-10-CM | POA: Diagnosis not present

## 2024-06-12 DIAGNOSIS — M5030 Other cervical disc degeneration, unspecified cervical region: Secondary | ICD-10-CM

## 2024-06-12 MED ORDER — MELOXICAM 15 MG PO TABS: 15.0000 mg | ORAL_TABLET | Freq: Every day | ORAL | 0 refills | Status: DC

## 2024-06-12 MED ORDER — DULOXETINE HCL 30 MG PO CPEP: 30.0000 mg | ORAL_CAPSULE | Freq: Every day | ORAL | 3 refills | Status: DC

## 2024-06-12 MED ORDER — OMEPRAZOLE 40 MG PO CPDR: 40.0000 mg | DELAYED_RELEASE_CAPSULE | Freq: Every day | ORAL | 3 refills | Status: AC

## 2024-06-12 NOTE — Progress Notes (Signed)
 Subjective:    Patient ID: Bryan Khan, male    DOB: 10-13-1970, 53 y.o.   MRN: [id number] 08/04/21 Patient is requesting a referral to a rheumatologist.  He has been dealing with chronic back pain and has been told that he has degenerative disc disease.  He also states that he hurts in numerous other joints around his body.  He states that he hurts everywhere.  He hurts in the joints of his hands including the PIP joints and the DIP joints.  He hurts in his shoulders bilaterally.  He hurts in his hips bilaterally, his knees bilaterally and his ankles.  He denies any muscle soreness.  He denies any rash.  He denies any fevers or chills or night sweats.  He denies any bruising or epistaxis or melena or hematochezia.  He denies any recent weight loss.  At that time, my plan was:  Patient reports pain all over his body and numerous joints.  There is no objective erythema or swelling.  Therefore I will begin with a series of lab test to evaluate for any evidence of autoimmune disease.  Check CBC to evaluate for any leukocytosis.  Check a CK to rule out myositis.  Check an ANA along with a CRP and a sed rate to look for evidence of autoimmune disease.  Check a rheumatoid factor and anti-CCP.  I will also check for Lyme titers.  If lab work is negative, I suspect the patient most likely has fibromyalgia.   08/18/21 Patient is here today to discuss his lab work.  I went over each lab test with the patient individually and explained the implication.  The only outlier was a mildly elevated CRP but I feel this is likely a mild elevation not related to a rheumatologic disease given his sed rate was 2.  His ANA was negative, rheumatoid factor was negative, CCP was negative, Lyme titers were negative, CK level was negative, and CBC was normal.  At that time, my  plan was:  We discussed a referral to rheumatology for a second opinion versus an empiric trial of Lyrica  for fibromyalgia.  Based on his reassuring  lab work I have decided to try Lyrica  75 mg twice daily for 1 month to see if this helps his pain.  At that point we will reassess.  Could also consider Cymbalta  if the Lyrica  is not beneficial.  06/14/22 Patient saw no benefit from the Lyrica  so he discontinued the medication.  He states that he is now tired all the time.  He reports feeling both hypersomnolence as well as chronic fatigue.  He states that his girlfriend says he stops breathing during his sleep.  He has awakened gasping for air on several occasions.  He is concerned that he may have sleep apnea.  In addition to the hypersomnolence and fatigue he reports decreased concentration.  I asked the patient if he feels like he could be depressed and he states that the chronic pain in his back does weigh on him but he denies depression.  06/12/24 Patient has not been seen in quite some time.  However he states that he is developing depression.  His depression stems from severe pain primarily in his neck, his lower back, and in his right greater than left knee.  He had surgery many years ago from torn meniscus in his right knee.  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  He also had an MRI of the cervical spine that showed some mild degenerative disc disease and foraminal stenosis but no significant nerve impingement in 2022 at the orthopedic office.  He was referred to physical therapy.  He states that over the last year the pain has become unbearable.  As a result he reports depression.  He does not want to do anything.  He has no energy.  He just wants to lay in bed.  He reports crippling pain that prevents him from enjoying life.  He denies any suicidal thoughts.  He denies any homicidal thoughts.  He denies any hallucinations.  He has tried Prozac in the past with bad side effects Past Medical History:   Diagnosis Date   Allergy    GERD (gastroesophageal reflux disease)    Reflux esophagitis    Warts, genital 11/2006   Current Outpatient Medications on File Prior to Visit  Medication Sig Dispense Refill   fluticasone  (FLONASE ) 50 MCG/ACT nasal spray Place 2 sprays into both nostrils daily. 16 g 11   No current facility-administered medications on file prior to visit.   Allergies  Allergen Reactions   Codeine Nausea Only   Social History   Socioeconomic History   Marital status: Divorced    Spouse name: Not on file   Number of children: 2   Years of education: Not on file   Highest education level: Not on file  Occupational History   Occupation: [employer]    Employer: [employer]  Tobacco Use   Smoking status: Former    Current packs/day: 0.00    Types: Cigarettes    Quit date: 02/01/1994    Years since quitting: 30.3   Smokeless tobacco: Never   Tobacco comments:    Quit x 20 years  Substance and Sexual Activity   Alcohol use: Yes    Alcohol/week: 2.0 standard drinks of alcohol    Types: 2 Standard drinks or equivalent per week    Comment: minimal now, 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   Drug use: No   Sexual activity: Not on file  Other Topics Concern   Not on file  Social History Narrative   ** Merged History Encounter **       Social Drivers of Health   Financial Resource Strain: Not on file  Food Insecurity: Not on file  Transportation Needs: Not on file  Physical Activity: Not on file  Stress: Not on file  Social Connections: Not on file  Intimate Partner Violence: Not on file      Review of Systems  Musculoskeletal:  Positive for back pain.  All other systems reviewed and are negative.      Objective:   Physical Exam Vitals reviewed.  Constitutional:      General: He is not in acute distress.    Appearance: He is well-developed. He is not diaphoretic.   HENT:     Head: Normocephalic and atraumatic.     Nose:     Right Turbinates: Not enlarged or swollen.     Left Turbinates: Not enlarged or swollen.     Right Sinus: No maxillary sinus tenderness or frontal sinus tenderness.     Left Sinus: No maxillary sinus tenderness or frontal sinus tenderness.  Neck:     Thyroid: No thyromegaly.     Vascular:  No JVD.     Trachea: No tracheal deviation.  Cardiovascular:     Rate and Rhythm: Normal rate and regular rhythm.     Heart sounds: Normal heart sounds. No murmur heard.    No friction rub. No gallop.  Pulmonary:     Effort: Pulmonary effort is normal. No respiratory distress.     Breath sounds: Normal breath sounds. No stridor. No wheezing or rales.  Chest:     Chest wall: No tenderness.  Abdominal:     General: Bowel sounds are normal. There is no distension.     Palpations: Abdomen is soft.     Tenderness: There is no abdominal tenderness. There is no guarding.  Musculoskeletal:        General: No swelling or deformity.     Right lower leg: No edema.     Left lower leg: No edema.  Lymphadenopathy:     Cervical: No cervical adenopathy.  Skin:    Coloration: Skin is not jaundiced.     Findings: No erythema or lesion.  Neurological:     Mental Status: He is alert.     Motor: No abnormal muscle tone.     Deep Tendon Reflexes: Reflexes are normal and symmetric.         Assessment & Plan:  Depression, major, single episode, mild  Degeneration of intervertebral disc of lumbar region with discogenic back pain  DDD (degenerative disc disease), cervical Based on my evaluation in 2022, I felt the patient most likely had fibromyalgia.  I believe that the pain in his neck and his back is inconsistent with the severity of the findings on the MRI from 2022.  Therefore given there has been progression already his pain is exacerbated by fibromyalgia and his depression.  As result I have recommended starting Cymbalta  30 mg a day and  uptitrating to 60 mg a day if tolerated.  He can also use meloxicam  15 mg a day for joint pain.  Recheck in 1 month or sooner if worse

## 2024-06-12 NOTE — Telephone Encounter (Signed)
 Refilld 06/12/24. Requested Prescriptions  Refused Prescriptions Disp Refills   omeprazole  (PRILOSEC) 40 MG capsule 90 capsule 2     Gastroenterology: Proton Pump Inhibitors Failed - 06/12/2024 12:41 PM      Failed - Valid encounter within last 12 months    Recent Outpatient Visits           Today Depression, major, single episode, mild   [place] [facility] Pickard, Butler DASEN, MD   12 months ago Injury of left ankle, initial encounter   [place] [facility] Kayla Jeoffrey RAMAN, FNP   1 year ago Fatigue, unspecified type    [facility] Pickard, Butler DASEN, MD

## 2024-07-09 ENCOUNTER — Encounter: Admitting: Family Medicine

## 2024-07-10 ENCOUNTER — Other Ambulatory Visit: Payer: Self-pay | Admitting: Family Medicine

## 2024-07-13 ENCOUNTER — Encounter: Admitting: Family Medicine

## 2024-08-21 ENCOUNTER — Other Ambulatory Visit: Payer: Self-pay | Admitting: Family Medicine

## 2024-08-30 ENCOUNTER — Other Ambulatory Visit: Payer: Self-pay | Admitting: Family Medicine

## 2024-10-21 ENCOUNTER — Other Ambulatory Visit: Payer: Self-pay | Admitting: Family Medicine
